# Patient Record
Sex: Female | Born: 1940 | Race: White | Hispanic: No | Marital: Married | Smoking: Former smoker
Health system: Southern US, Community
[De-identification: ages and names within clinical notes are randomized; demographics above are authoritative.]

## PROBLEM LIST (undated history)

## (undated) DIAGNOSIS — E785 Hyperlipidemia, unspecified: Secondary | ICD-10-CM

## (undated) DIAGNOSIS — K8020 Calculus of gallbladder without cholecystitis without obstruction: Secondary | ICD-10-CM

## (undated) DIAGNOSIS — I10 Essential (primary) hypertension: Secondary | ICD-10-CM

## (undated) DIAGNOSIS — F419 Anxiety disorder, unspecified: Secondary | ICD-10-CM

## (undated) DIAGNOSIS — R109 Unspecified abdominal pain: Secondary | ICD-10-CM

## (undated) DIAGNOSIS — M5430 Sciatica, unspecified side: Secondary | ICD-10-CM

## (undated) HISTORY — PX: CHOLECYSTECTOMY: SHX55

## (undated) HISTORY — PX: OTHER SURGICAL HISTORY: SHX169

## (undated) HISTORY — DX: Sciatica, unspecified side: M54.30

## (undated) HISTORY — DX: Calculus of gallbladder without cholecystitis without obstruction: K80.20

## (undated) HISTORY — DX: Anxiety disorder, unspecified: F41.9

## (undated) HISTORY — DX: Essential (primary) hypertension: I10

## (undated) HISTORY — DX: Hyperlipidemia, unspecified: E78.5

## (undated) HISTORY — DX: Unspecified abdominal pain: R10.9

---

## 1987-12-31 DIAGNOSIS — I471 Supraventricular tachycardia, unspecified: Secondary | ICD-10-CM

## 1987-12-31 HISTORY — DX: Supraventricular tachycardia: I47.1

## 1987-12-31 HISTORY — DX: Supraventricular tachycardia, unspecified: I47.10

## 1988-12-30 HISTORY — PX: CHOLECYSTECTOMY: SHX55

## 1996-12-30 DIAGNOSIS — K589 Irritable bowel syndrome without diarrhea: Secondary | ICD-10-CM

## 1996-12-30 HISTORY — DX: Irritable bowel syndrome, unspecified: K58.9

## 1998-12-30 HISTORY — PX: OTHER SURGICAL HISTORY: SHX169

## 2008-08-15 ENCOUNTER — Encounter: Payer: Self-pay | Admitting: Physician Assistant

## 2008-08-15 ENCOUNTER — Encounter: Payer: Self-pay | Admitting: Cardiology

## 2008-08-15 ENCOUNTER — Ambulatory Visit: Payer: Self-pay | Admitting: Cardiology

## 2008-08-16 ENCOUNTER — Encounter: Payer: Self-pay | Admitting: Cardiology

## 2008-09-14 ENCOUNTER — Ambulatory Visit: Payer: Self-pay | Admitting: Cardiology

## 2008-09-15 ENCOUNTER — Encounter: Payer: Self-pay | Admitting: Physician Assistant

## 2008-10-06 ENCOUNTER — Ambulatory Visit: Payer: Self-pay | Admitting: Cardiology

## 2009-08-08 DIAGNOSIS — K449 Diaphragmatic hernia without obstruction or gangrene: Secondary | ICD-10-CM | POA: Insufficient documentation

## 2009-08-08 DIAGNOSIS — D3190 Benign neoplasm of unspecified part of unspecified eye: Secondary | ICD-10-CM | POA: Insufficient documentation

## 2009-08-08 DIAGNOSIS — K219 Gastro-esophageal reflux disease without esophagitis: Secondary | ICD-10-CM | POA: Insufficient documentation

## 2009-08-08 DIAGNOSIS — Z87891 Personal history of nicotine dependence: Secondary | ICD-10-CM | POA: Insufficient documentation

## 2009-08-08 DIAGNOSIS — E785 Hyperlipidemia, unspecified: Secondary | ICD-10-CM | POA: Insufficient documentation

## 2009-08-08 DIAGNOSIS — R079 Chest pain, unspecified: Secondary | ICD-10-CM | POA: Insufficient documentation

## 2009-08-08 DIAGNOSIS — R002 Palpitations: Secondary | ICD-10-CM | POA: Insufficient documentation

## 2009-08-08 DIAGNOSIS — E049 Nontoxic goiter, unspecified: Secondary | ICD-10-CM | POA: Insufficient documentation

## 2009-08-09 ENCOUNTER — Encounter: Payer: Self-pay | Admitting: Cardiology

## 2009-08-09 ENCOUNTER — Ambulatory Visit: Payer: Self-pay | Admitting: Cardiology

## 2009-08-10 ENCOUNTER — Encounter: Payer: Self-pay | Admitting: Cardiology

## 2009-08-24 ENCOUNTER — Encounter (INDEPENDENT_AMBULATORY_CARE_PROVIDER_SITE_OTHER): Payer: Self-pay | Admitting: *Deleted

## 2011-05-14 NOTE — Assessment & Plan Note (Signed)
[facility]                          [facility]ICE NOTE   NAME:Suzanne Davis, Suzanne Davis                         MRN:          [id number]  DATE:10/06/2008                            DOB:          14-Jun-1941    Ms. Knouff is here for the followup post hospitalization of her  evaluation for chest pain and palpitations.  I have reviewed all of her  hospital records.  I have also reviewed the event recorder that she wore  after hospitalization.   The patient was hospitalized on August 15, 2008.  She had some chest  pain and she had heart fluttering.  In the hospital, her workup included  a 2-D echo showing normal LV function and a Cardiolite revealing no scar  and no ischemia.  We arranged for an outpatient event recorder.  This  was done.  The patient responded extensively for a prolonged period of  time with multiple episodes of skipped beats.  Each time she had a  single PVC or single premature atrial contraction.  On a few occasions,  she had 3 or 4 beats of supraventricular tachycardia.  These could  possibly be atrial fib or possibly a form of reentrant supraventricular  tachycardia.  She does not have significant symptoms with these.  She  did not have chest pain.  There was no syncope or presyncope.  She has  continued to be active.  She has not had excess alcohol intake and there  has been no excess caffeine intake.   PAST MEDICAL HISTORY:  Allergies TETRACYCLINE.   MEDICATIONS:  Oxycodone, Xanax, Crestor 5, and aspirin 81 mg.   OTHER MEDICAL PROBLEMS:  See the complete list below.   REVIEW OF SYSTEMS:  She is not having any GI or GU symptoms.  She has no  headaches.  Her review of systems otherwise is negative.   PHYSICAL EXAMINATION:  VITAL SIGNS:  Blood pressure is 129/76 with a  pulse of 74.  GENERAL:  The patient is oriented to person, time, and place.  Affect is  normal.  HEENT:  No xanthelasma.  She has normal extraocular motion.   There are  no carotid bruits.  There is no jugular venous distention.  LUNGS:  Clear.  Respiratory effort is not labored.  CARDIAC:  S1 and S2.  There are no clicks or significant murmurs.  ABDOMEN:  Soft.  She has no significant peripheral edema.   PROBLEMS:  1. Dyslipidemia.  2. Gastroesophageal reflux disease status post esophageal dilatation      in 2005.  3. History of a goiter.  4. History of osteoarthritis.  5. History of benign liver tumor resection.  6. Status post cholecystectomy.  7. Chest pain that was evaluated in the hospital with a normal      Cardiolite.  8. Normal left ventricular function.  9. Palpitations.   The patient gives a history to me today of some supraventricular  tachycardia documented in the past.  She said she had more of this in  the past.  Most recently, she has had palpitations.  As outlined from  the review of the monitor, she has PACs and PVCs and very short bursts  (3-4 beats of supraventricular tachycardia).  I talked with her about  the possibility of a beta blocker or a calcium-channel blocker.  She  prefers not to be treated at this point.  I spoke with her at length  making sure that she knew that she should be reassured and that none of  these rhythms were dangerous for her.  We will not change her meds at  this time.  I will see her back for a cardiology followup in 6 months.     Luis Abed, MD, [facility]  Electronically Signed    JDK/MedQ  DD: 10/06/2008  DT: 10/07/2008  Job #: [id number]

## 2012-02-07 DIAGNOSIS — M549 Dorsalgia, unspecified: Secondary | ICD-10-CM | POA: Diagnosis not present

## 2012-02-07 DIAGNOSIS — L240 Irritant contact dermatitis due to detergents: Secondary | ICD-10-CM | POA: Diagnosis not present

## 2012-02-07 DIAGNOSIS — I10 Essential (primary) hypertension: Secondary | ICD-10-CM | POA: Diagnosis not present

## 2012-02-07 DIAGNOSIS — F411 Generalized anxiety disorder: Secondary | ICD-10-CM | POA: Diagnosis not present

## 2012-03-02 DIAGNOSIS — R21 Rash and other nonspecific skin eruption: Secondary | ICD-10-CM | POA: Diagnosis not present

## 2012-03-02 DIAGNOSIS — Z23 Encounter for immunization: Secondary | ICD-10-CM | POA: Diagnosis not present

## 2012-03-02 DIAGNOSIS — A310 Pulmonary mycobacterial infection: Secondary | ICD-10-CM | POA: Diagnosis not present

## 2012-04-07 DIAGNOSIS — I10 Essential (primary) hypertension: Secondary | ICD-10-CM | POA: Diagnosis not present

## 2012-05-31 DIAGNOSIS — M79609 Pain in unspecified limb: Secondary | ICD-10-CM | POA: Diagnosis not present

## 2012-05-31 DIAGNOSIS — Z79899 Other long term (current) drug therapy: Secondary | ICD-10-CM | POA: Diagnosis not present

## 2012-05-31 DIAGNOSIS — Z86718 Personal history of other venous thrombosis and embolism: Secondary | ICD-10-CM | POA: Diagnosis not present

## 2012-05-31 DIAGNOSIS — Z7982 Long term (current) use of aspirin: Secondary | ICD-10-CM | POA: Diagnosis not present

## 2012-05-31 DIAGNOSIS — M7989 Other specified soft tissue disorders: Secondary | ICD-10-CM | POA: Diagnosis not present

## 2012-06-01 DIAGNOSIS — M7989 Other specified soft tissue disorders: Secondary | ICD-10-CM | POA: Diagnosis not present

## 2012-06-01 DIAGNOSIS — M79609 Pain in unspecified limb: Secondary | ICD-10-CM | POA: Diagnosis not present

## 2012-06-05 DIAGNOSIS — L570 Actinic keratosis: Secondary | ICD-10-CM | POA: Diagnosis not present

## 2012-06-05 DIAGNOSIS — IMO0001 Reserved for inherently not codable concepts without codable children: Secondary | ICD-10-CM | POA: Diagnosis not present

## 2012-06-05 DIAGNOSIS — R5383 Other fatigue: Secondary | ICD-10-CM | POA: Diagnosis not present

## 2012-06-05 DIAGNOSIS — E569 Vitamin deficiency, unspecified: Secondary | ICD-10-CM | POA: Diagnosis not present

## 2012-06-05 DIAGNOSIS — E782 Mixed hyperlipidemia: Secondary | ICD-10-CM | POA: Diagnosis not present

## 2012-06-05 DIAGNOSIS — M549 Dorsalgia, unspecified: Secondary | ICD-10-CM | POA: Diagnosis not present

## 2012-06-11 DIAGNOSIS — D3120 Benign neoplasm of unspecified retina: Secondary | ICD-10-CM | POA: Diagnosis not present

## 2012-06-11 DIAGNOSIS — H04129 Dry eye syndrome of unspecified lacrimal gland: Secondary | ICD-10-CM | POA: Diagnosis not present

## 2012-06-29 DIAGNOSIS — I803 Phlebitis and thrombophlebitis of lower extremities, unspecified: Secondary | ICD-10-CM | POA: Diagnosis not present

## 2012-08-04 DIAGNOSIS — I10 Essential (primary) hypertension: Secondary | ICD-10-CM | POA: Diagnosis not present

## 2012-08-10 DIAGNOSIS — I803 Phlebitis and thrombophlebitis of lower extremities, unspecified: Secondary | ICD-10-CM | POA: Diagnosis not present

## 2012-08-24 DIAGNOSIS — M79609 Pain in unspecified limb: Secondary | ICD-10-CM | POA: Diagnosis not present

## 2012-08-24 DIAGNOSIS — M7989 Other specified soft tissue disorders: Secondary | ICD-10-CM | POA: Diagnosis not present

## 2012-09-02 DIAGNOSIS — L570 Actinic keratosis: Secondary | ICD-10-CM | POA: Diagnosis not present

## 2012-09-15 DIAGNOSIS — N644 Mastodynia: Secondary | ICD-10-CM | POA: Diagnosis not present

## 2012-09-16 DIAGNOSIS — H04129 Dry eye syndrome of unspecified lacrimal gland: Secondary | ICD-10-CM | POA: Diagnosis not present

## 2012-09-16 DIAGNOSIS — D3120 Benign neoplasm of unspecified retina: Secondary | ICD-10-CM | POA: Diagnosis not present

## 2012-10-06 DIAGNOSIS — Z23 Encounter for immunization: Secondary | ICD-10-CM | POA: Diagnosis not present

## 2012-10-30 DIAGNOSIS — M549 Dorsalgia, unspecified: Secondary | ICD-10-CM | POA: Diagnosis not present

## 2012-11-16 DIAGNOSIS — I83893 Varicose veins of bilateral lower extremities with other complications: Secondary | ICD-10-CM | POA: Diagnosis not present

## 2012-12-28 DIAGNOSIS — I10 Essential (primary) hypertension: Secondary | ICD-10-CM | POA: Diagnosis not present

## 2012-12-28 DIAGNOSIS — Z131 Encounter for screening for diabetes mellitus: Secondary | ICD-10-CM | POA: Diagnosis not present

## 2013-02-25 DIAGNOSIS — I10 Essential (primary) hypertension: Secondary | ICD-10-CM | POA: Diagnosis not present

## 2013-03-31 DIAGNOSIS — L259 Unspecified contact dermatitis, unspecified cause: Secondary | ICD-10-CM | POA: Diagnosis not present

## 2013-03-31 DIAGNOSIS — C44519 Basal cell carcinoma of skin of other part of trunk: Secondary | ICD-10-CM | POA: Diagnosis not present

## 2013-03-31 DIAGNOSIS — D485 Neoplasm of uncertain behavior of skin: Secondary | ICD-10-CM | POA: Diagnosis not present

## 2013-03-31 DIAGNOSIS — Z85828 Personal history of other malignant neoplasm of skin: Secondary | ICD-10-CM | POA: Diagnosis not present

## 2013-03-31 DIAGNOSIS — L820 Inflamed seborrheic keratosis: Secondary | ICD-10-CM | POA: Diagnosis not present

## 2013-03-31 DIAGNOSIS — L821 Other seborrheic keratosis: Secondary | ICD-10-CM | POA: Diagnosis not present

## 2013-03-31 DIAGNOSIS — D1801 Hemangioma of skin and subcutaneous tissue: Secondary | ICD-10-CM | POA: Diagnosis not present

## 2013-03-31 DIAGNOSIS — L570 Actinic keratosis: Secondary | ICD-10-CM | POA: Diagnosis not present

## 2013-04-23 DIAGNOSIS — I10 Essential (primary) hypertension: Secondary | ICD-10-CM | POA: Diagnosis not present

## 2013-04-26 DIAGNOSIS — Z23 Encounter for immunization: Secondary | ICD-10-CM | POA: Diagnosis not present

## 2013-04-26 DIAGNOSIS — A318 Other mycobacterial infections: Secondary | ICD-10-CM | POA: Diagnosis not present

## 2013-04-26 DIAGNOSIS — A310 Pulmonary mycobacterial infection: Secondary | ICD-10-CM | POA: Diagnosis not present

## 2013-05-13 DIAGNOSIS — L570 Actinic keratosis: Secondary | ICD-10-CM | POA: Diagnosis not present

## 2013-05-13 DIAGNOSIS — C4491 Basal cell carcinoma of skin, unspecified: Secondary | ICD-10-CM | POA: Diagnosis not present

## 2013-06-25 DIAGNOSIS — K2900 Acute gastritis without bleeding: Secondary | ICD-10-CM | POA: Diagnosis not present

## 2013-07-30 DIAGNOSIS — R109 Unspecified abdominal pain: Secondary | ICD-10-CM | POA: Diagnosis not present

## 2013-07-30 DIAGNOSIS — R1310 Dysphagia, unspecified: Secondary | ICD-10-CM | POA: Diagnosis not present

## 2013-08-16 DIAGNOSIS — K589 Irritable bowel syndrome without diarrhea: Secondary | ICD-10-CM | POA: Diagnosis not present

## 2013-08-16 DIAGNOSIS — Z79899 Other long term (current) drug therapy: Secondary | ICD-10-CM | POA: Diagnosis not present

## 2013-08-16 DIAGNOSIS — M549 Dorsalgia, unspecified: Secondary | ICD-10-CM | POA: Diagnosis not present

## 2013-08-16 DIAGNOSIS — Q391 Atresia of esophagus with tracheo-esophageal fistula: Secondary | ICD-10-CM | POA: Diagnosis not present

## 2013-08-16 DIAGNOSIS — Z8489 Family history of other specified conditions: Secondary | ICD-10-CM | POA: Diagnosis not present

## 2013-08-16 DIAGNOSIS — G8929 Other chronic pain: Secondary | ICD-10-CM | POA: Diagnosis not present

## 2013-08-16 DIAGNOSIS — Z888 Allergy status to other drugs, medicaments and biological substances status: Secondary | ICD-10-CM | POA: Diagnosis not present

## 2013-08-16 DIAGNOSIS — Z91040 Latex allergy status: Secondary | ICD-10-CM | POA: Diagnosis not present

## 2013-08-16 DIAGNOSIS — Z7982 Long term (current) use of aspirin: Secondary | ICD-10-CM | POA: Diagnosis not present

## 2013-08-16 DIAGNOSIS — D126 Benign neoplasm of colon, unspecified: Secondary | ICD-10-CM | POA: Diagnosis not present

## 2013-08-16 DIAGNOSIS — R1310 Dysphagia, unspecified: Secondary | ICD-10-CM | POA: Diagnosis not present

## 2013-08-16 DIAGNOSIS — R109 Unspecified abdominal pain: Secondary | ICD-10-CM | POA: Diagnosis not present

## 2013-08-16 DIAGNOSIS — F411 Generalized anxiety disorder: Secondary | ICD-10-CM | POA: Diagnosis not present

## 2013-08-16 DIAGNOSIS — Z87891 Personal history of nicotine dependence: Secondary | ICD-10-CM | POA: Diagnosis not present

## 2013-08-16 DIAGNOSIS — Z883 Allergy status to other anti-infective agents status: Secondary | ICD-10-CM | POA: Diagnosis not present

## 2013-08-16 DIAGNOSIS — Z823 Family history of stroke: Secondary | ICD-10-CM | POA: Diagnosis not present

## 2013-08-16 DIAGNOSIS — I10 Essential (primary) hypertension: Secondary | ICD-10-CM | POA: Diagnosis not present

## 2013-08-16 DIAGNOSIS — K5730 Diverticulosis of large intestine without perforation or abscess without bleeding: Secondary | ICD-10-CM | POA: Diagnosis not present

## 2013-08-16 DIAGNOSIS — Z91041 Radiographic dye allergy status: Secondary | ICD-10-CM | POA: Diagnosis not present

## 2013-08-16 DIAGNOSIS — K2970 Gastritis, unspecified, without bleeding: Secondary | ICD-10-CM | POA: Diagnosis not present

## 2013-08-17 DIAGNOSIS — Z7982 Long term (current) use of aspirin: Secondary | ICD-10-CM | POA: Diagnosis not present

## 2013-08-17 DIAGNOSIS — R1011 Right upper quadrant pain: Secondary | ICD-10-CM | POA: Diagnosis not present

## 2013-08-17 DIAGNOSIS — R1110 Vomiting, unspecified: Secondary | ICD-10-CM | POA: Diagnosis not present

## 2013-08-17 DIAGNOSIS — R112 Nausea with vomiting, unspecified: Secondary | ICD-10-CM | POA: Diagnosis not present

## 2013-08-17 DIAGNOSIS — R1310 Dysphagia, unspecified: Secondary | ICD-10-CM | POA: Diagnosis not present

## 2013-08-17 DIAGNOSIS — M542 Cervicalgia: Secondary | ICD-10-CM | POA: Diagnosis not present

## 2013-08-17 DIAGNOSIS — Z79899 Other long term (current) drug therapy: Secondary | ICD-10-CM | POA: Diagnosis not present

## 2013-08-24 DIAGNOSIS — K2900 Acute gastritis without bleeding: Secondary | ICD-10-CM | POA: Diagnosis not present

## 2013-08-24 DIAGNOSIS — Z0000 Encounter for general adult medical examination without abnormal findings: Secondary | ICD-10-CM | POA: Diagnosis not present

## 2013-10-21 DIAGNOSIS — E119 Type 2 diabetes mellitus without complications: Secondary | ICD-10-CM | POA: Diagnosis not present

## 2013-10-21 DIAGNOSIS — I10 Essential (primary) hypertension: Secondary | ICD-10-CM | POA: Diagnosis not present

## 2013-10-21 DIAGNOSIS — Z0000 Encounter for general adult medical examination without abnormal findings: Secondary | ICD-10-CM | POA: Diagnosis not present

## 2013-10-27 DIAGNOSIS — J479 Bronchiectasis, uncomplicated: Secondary | ICD-10-CM | POA: Diagnosis not present

## 2013-10-27 DIAGNOSIS — Z7189 Other specified counseling: Secondary | ICD-10-CM | POA: Diagnosis not present

## 2013-10-27 DIAGNOSIS — R918 Other nonspecific abnormal finding of lung field: Secondary | ICD-10-CM | POA: Diagnosis not present

## 2013-10-27 DIAGNOSIS — R002 Palpitations: Secondary | ICD-10-CM | POA: Diagnosis not present

## 2013-10-27 DIAGNOSIS — A310 Pulmonary mycobacterial infection: Secondary | ICD-10-CM | POA: Diagnosis not present

## 2013-10-27 DIAGNOSIS — J984 Other disorders of lung: Secondary | ICD-10-CM | POA: Diagnosis not present

## 2013-10-27 DIAGNOSIS — R490 Dysphonia: Secondary | ICD-10-CM | POA: Diagnosis not present

## 2013-10-27 DIAGNOSIS — R05 Cough: Secondary | ICD-10-CM | POA: Diagnosis not present

## 2013-10-27 DIAGNOSIS — M1990 Unspecified osteoarthritis, unspecified site: Secondary | ICD-10-CM | POA: Diagnosis not present

## 2013-10-29 DIAGNOSIS — J471 Bronchiectasis with (acute) exacerbation: Secondary | ICD-10-CM | POA: Diagnosis not present

## 2013-10-29 DIAGNOSIS — A310 Pulmonary mycobacterial infection: Secondary | ICD-10-CM | POA: Diagnosis not present

## 2013-12-14 DIAGNOSIS — N644 Mastodynia: Secondary | ICD-10-CM | POA: Diagnosis not present

## 2013-12-14 DIAGNOSIS — R928 Other abnormal and inconclusive findings on diagnostic imaging of breast: Secondary | ICD-10-CM | POA: Diagnosis not present

## 2013-12-21 DIAGNOSIS — M549 Dorsalgia, unspecified: Secondary | ICD-10-CM | POA: Diagnosis not present

## 2013-12-21 DIAGNOSIS — I10 Essential (primary) hypertension: Secondary | ICD-10-CM | POA: Diagnosis not present

## 2013-12-29 DIAGNOSIS — M5126 Other intervertebral disc displacement, lumbar region: Secondary | ICD-10-CM | POA: Diagnosis not present

## 2013-12-29 DIAGNOSIS — M47817 Spondylosis without myelopathy or radiculopathy, lumbosacral region: Secondary | ICD-10-CM | POA: Diagnosis not present

## 2013-12-29 DIAGNOSIS — M5137 Other intervertebral disc degeneration, lumbosacral region: Secondary | ICD-10-CM | POA: Diagnosis not present

## 2013-12-29 DIAGNOSIS — M545 Low back pain, unspecified: Secondary | ICD-10-CM | POA: Diagnosis not present

## 2013-12-29 DIAGNOSIS — M48061 Spinal stenosis, lumbar region without neurogenic claudication: Secondary | ICD-10-CM | POA: Diagnosis not present

## 2014-01-17 DIAGNOSIS — M949 Disorder of cartilage, unspecified: Secondary | ICD-10-CM | POA: Diagnosis not present

## 2014-01-17 DIAGNOSIS — M810 Age-related osteoporosis without current pathological fracture: Secondary | ICD-10-CM | POA: Diagnosis not present

## 2014-01-17 DIAGNOSIS — Z780 Asymptomatic menopausal state: Secondary | ICD-10-CM | POA: Diagnosis not present

## 2014-01-17 DIAGNOSIS — M899 Disorder of bone, unspecified: Secondary | ICD-10-CM | POA: Diagnosis not present

## 2014-01-17 DIAGNOSIS — Z7982 Long term (current) use of aspirin: Secondary | ICD-10-CM | POA: Diagnosis not present

## 2014-01-17 DIAGNOSIS — Z79899 Other long term (current) drug therapy: Secondary | ICD-10-CM | POA: Diagnosis not present

## 2014-02-21 DIAGNOSIS — M549 Dorsalgia, unspecified: Secondary | ICD-10-CM | POA: Diagnosis not present

## 2014-02-21 DIAGNOSIS — I10 Essential (primary) hypertension: Secondary | ICD-10-CM | POA: Diagnosis not present

## 2014-03-14 DIAGNOSIS — A310 Pulmonary mycobacterial infection: Secondary | ICD-10-CM | POA: Diagnosis not present

## 2014-03-14 DIAGNOSIS — J479 Bronchiectasis, uncomplicated: Secondary | ICD-10-CM | POA: Diagnosis not present

## 2014-03-24 DIAGNOSIS — M47817 Spondylosis without myelopathy or radiculopathy, lumbosacral region: Secondary | ICD-10-CM | POA: Diagnosis not present

## 2014-04-18 DIAGNOSIS — Z79899 Other long term (current) drug therapy: Secondary | ICD-10-CM | POA: Diagnosis not present

## 2014-04-18 DIAGNOSIS — I10 Essential (primary) hypertension: Secondary | ICD-10-CM | POA: Diagnosis not present

## 2014-04-18 DIAGNOSIS — A310 Pulmonary mycobacterial infection: Secondary | ICD-10-CM | POA: Diagnosis not present

## 2014-04-18 DIAGNOSIS — F4321 Adjustment disorder with depressed mood: Secondary | ICD-10-CM | POA: Diagnosis not present

## 2014-04-18 DIAGNOSIS — A318 Other mycobacterial infections: Secondary | ICD-10-CM | POA: Diagnosis not present

## 2014-04-18 DIAGNOSIS — Z0000 Encounter for general adult medical examination without abnormal findings: Secondary | ICD-10-CM | POA: Diagnosis not present

## 2014-04-18 DIAGNOSIS — Z23 Encounter for immunization: Secondary | ICD-10-CM | POA: Diagnosis not present

## 2014-04-21 DIAGNOSIS — Z131 Encounter for screening for diabetes mellitus: Secondary | ICD-10-CM | POA: Diagnosis not present

## 2014-04-21 DIAGNOSIS — Z0000 Encounter for general adult medical examination without abnormal findings: Secondary | ICD-10-CM | POA: Diagnosis not present

## 2014-04-21 DIAGNOSIS — I10 Essential (primary) hypertension: Secondary | ICD-10-CM | POA: Diagnosis not present

## 2014-04-21 DIAGNOSIS — K219 Gastro-esophageal reflux disease without esophagitis: Secondary | ICD-10-CM | POA: Diagnosis not present

## 2014-06-17 DIAGNOSIS — L2089 Other atopic dermatitis: Secondary | ICD-10-CM | POA: Diagnosis not present

## 2014-06-29 DIAGNOSIS — Z9189 Other specified personal risk factors, not elsewhere classified: Secondary | ICD-10-CM | POA: Diagnosis not present

## 2014-06-29 DIAGNOSIS — Z1231 Encounter for screening mammogram for malignant neoplasm of breast: Secondary | ICD-10-CM | POA: Diagnosis not present

## 2014-06-29 DIAGNOSIS — N952 Postmenopausal atrophic vaginitis: Secondary | ICD-10-CM | POA: Diagnosis not present

## 2014-06-29 DIAGNOSIS — R350 Frequency of micturition: Secondary | ICD-10-CM | POA: Diagnosis not present

## 2014-08-15 DIAGNOSIS — J310 Chronic rhinitis: Secondary | ICD-10-CM | POA: Diagnosis not present

## 2014-08-15 DIAGNOSIS — I10 Essential (primary) hypertension: Secondary | ICD-10-CM | POA: Diagnosis not present

## 2014-10-06 ENCOUNTER — Encounter: Payer: Self-pay | Admitting: Interventional Cardiology

## 2014-10-13 DIAGNOSIS — I10 Essential (primary) hypertension: Secondary | ICD-10-CM | POA: Diagnosis not present

## 2014-10-13 DIAGNOSIS — E119 Type 2 diabetes mellitus without complications: Secondary | ICD-10-CM | POA: Diagnosis not present

## 2014-10-13 DIAGNOSIS — M545 Low back pain: Secondary | ICD-10-CM | POA: Diagnosis not present

## 2014-10-28 ENCOUNTER — Ambulatory Visit: Payer: Self-pay | Admitting: Interventional Cardiology

## 2014-11-16 DIAGNOSIS — H524 Presbyopia: Secondary | ICD-10-CM | POA: Diagnosis not present

## 2014-11-16 DIAGNOSIS — H5203 Hypermetropia, bilateral: Secondary | ICD-10-CM | POA: Diagnosis not present

## 2014-11-16 DIAGNOSIS — H52223 Regular astigmatism, bilateral: Secondary | ICD-10-CM | POA: Diagnosis not present

## 2014-11-16 DIAGNOSIS — H25813 Combined forms of age-related cataract, bilateral: Secondary | ICD-10-CM | POA: Diagnosis not present

## 2014-11-16 DIAGNOSIS — H43811 Vitreous degeneration, right eye: Secondary | ICD-10-CM | POA: Diagnosis not present

## 2014-11-17 DIAGNOSIS — Z23 Encounter for immunization: Secondary | ICD-10-CM | POA: Diagnosis not present

## 2014-11-22 ENCOUNTER — Encounter: Payer: Self-pay | Admitting: Interventional Cardiology

## 2014-11-30 DIAGNOSIS — C44519 Basal cell carcinoma of skin of other part of trunk: Secondary | ICD-10-CM | POA: Diagnosis not present

## 2014-11-30 DIAGNOSIS — L299 Pruritus, unspecified: Secondary | ICD-10-CM | POA: Diagnosis not present

## 2014-11-30 DIAGNOSIS — L570 Actinic keratosis: Secondary | ICD-10-CM | POA: Diagnosis not present

## 2014-11-30 DIAGNOSIS — C4491 Basal cell carcinoma of skin, unspecified: Secondary | ICD-10-CM | POA: Diagnosis not present

## 2014-12-15 DIAGNOSIS — Z0000 Encounter for general adult medical examination without abnormal findings: Secondary | ICD-10-CM | POA: Diagnosis not present

## 2014-12-15 DIAGNOSIS — R1030 Lower abdominal pain, unspecified: Secondary | ICD-10-CM | POA: Diagnosis not present

## 2014-12-15 DIAGNOSIS — Z1389 Encounter for screening for other disorder: Secondary | ICD-10-CM | POA: Diagnosis not present

## 2014-12-15 DIAGNOSIS — E119 Type 2 diabetes mellitus without complications: Secondary | ICD-10-CM | POA: Diagnosis not present

## 2014-12-15 DIAGNOSIS — I10 Essential (primary) hypertension: Secondary | ICD-10-CM | POA: Diagnosis not present

## 2014-12-15 DIAGNOSIS — M545 Low back pain: Secondary | ICD-10-CM | POA: Diagnosis not present

## 2014-12-27 DIAGNOSIS — R1030 Lower abdominal pain, unspecified: Secondary | ICD-10-CM | POA: Diagnosis not present

## 2014-12-27 DIAGNOSIS — Z1231 Encounter for screening mammogram for malignant neoplasm of breast: Secondary | ICD-10-CM | POA: Diagnosis not present

## 2014-12-27 DIAGNOSIS — R102 Pelvic and perineal pain: Secondary | ICD-10-CM | POA: Diagnosis not present

## 2015-02-01 ENCOUNTER — Ambulatory Visit: Payer: Self-pay | Admitting: Interventional Cardiology

## 2015-02-06 DIAGNOSIS — Z8679 Personal history of other diseases of the circulatory system: Secondary | ICD-10-CM | POA: Diagnosis not present

## 2015-02-06 DIAGNOSIS — I491 Atrial premature depolarization: Secondary | ICD-10-CM | POA: Diagnosis not present

## 2015-02-06 DIAGNOSIS — I480 Paroxysmal atrial fibrillation: Secondary | ICD-10-CM | POA: Diagnosis not present

## 2015-02-06 DIAGNOSIS — R002 Palpitations: Secondary | ICD-10-CM | POA: Diagnosis not present

## 2015-02-06 DIAGNOSIS — I471 Supraventricular tachycardia: Secondary | ICD-10-CM | POA: Diagnosis not present

## 2015-02-14 DIAGNOSIS — I10 Essential (primary) hypertension: Secondary | ICD-10-CM | POA: Diagnosis not present

## 2015-02-14 DIAGNOSIS — M542 Cervicalgia: Secondary | ICD-10-CM | POA: Diagnosis not present

## 2015-02-14 DIAGNOSIS — E119 Type 2 diabetes mellitus without complications: Secondary | ICD-10-CM | POA: Diagnosis not present

## 2015-02-23 DIAGNOSIS — R9439 Abnormal result of other cardiovascular function study: Secondary | ICD-10-CM | POA: Diagnosis not present

## 2015-02-23 DIAGNOSIS — R002 Palpitations: Secondary | ICD-10-CM | POA: Diagnosis not present

## 2015-02-23 DIAGNOSIS — Z8679 Personal history of other diseases of the circulatory system: Secondary | ICD-10-CM | POA: Diagnosis not present

## 2015-02-27 DIAGNOSIS — R9439 Abnormal result of other cardiovascular function study: Secondary | ICD-10-CM | POA: Diagnosis not present

## 2015-02-27 DIAGNOSIS — K219 Gastro-esophageal reflux disease without esophagitis: Secondary | ICD-10-CM | POA: Diagnosis not present

## 2015-02-27 DIAGNOSIS — I471 Supraventricular tachycardia: Secondary | ICD-10-CM | POA: Diagnosis not present

## 2015-02-27 DIAGNOSIS — Z79899 Other long term (current) drug therapy: Secondary | ICD-10-CM | POA: Diagnosis not present

## 2015-04-14 DIAGNOSIS — N6459 Other signs and symptoms in breast: Secondary | ICD-10-CM | POA: Diagnosis not present

## 2015-04-14 DIAGNOSIS — I10 Essential (primary) hypertension: Secondary | ICD-10-CM | POA: Diagnosis not present

## 2015-04-14 DIAGNOSIS — E119 Type 2 diabetes mellitus without complications: Secondary | ICD-10-CM | POA: Diagnosis not present

## 2015-04-17 ENCOUNTER — Other Ambulatory Visit: Payer: Self-pay | Admitting: Internal Medicine

## 2015-04-17 DIAGNOSIS — N644 Mastodynia: Secondary | ICD-10-CM

## 2015-04-17 DIAGNOSIS — R922 Inconclusive mammogram: Secondary | ICD-10-CM

## 2015-04-26 DIAGNOSIS — N6459 Other signs and symptoms in breast: Secondary | ICD-10-CM | POA: Diagnosis not present

## 2015-04-26 DIAGNOSIS — R922 Inconclusive mammogram: Secondary | ICD-10-CM | POA: Diagnosis not present

## 2015-05-08 DIAGNOSIS — A310 Pulmonary mycobacterial infection: Secondary | ICD-10-CM | POA: Diagnosis not present

## 2015-05-17 DIAGNOSIS — N644 Mastodynia: Secondary | ICD-10-CM | POA: Diagnosis not present

## 2015-05-17 DIAGNOSIS — N6459 Other signs and symptoms in breast: Secondary | ICD-10-CM | POA: Diagnosis not present

## 2015-06-12 DIAGNOSIS — E119 Type 2 diabetes mellitus without complications: Secondary | ICD-10-CM | POA: Diagnosis not present

## 2015-06-12 DIAGNOSIS — I10 Essential (primary) hypertension: Secondary | ICD-10-CM | POA: Diagnosis not present

## 2015-06-27 ENCOUNTER — Ambulatory Visit
Admission: RE | Admit: 2015-06-27 | Discharge: 2015-06-27 | Disposition: A | Discharge status: Home / Self Care | Payer: Medicare Other | Source: Ambulatory Visit | Attending: Internal Medicine | Admitting: Internal Medicine

## 2015-06-27 DIAGNOSIS — R922 Inconclusive mammogram: Secondary | ICD-10-CM

## 2015-06-27 DIAGNOSIS — N644 Mastodynia: Secondary | ICD-10-CM | POA: Diagnosis not present

## 2015-06-27 MED ORDER — GADOBENATE DIMEGLUMINE 529 MG/ML IV SOLN: 10.0000 mL | Freq: Once | INTRAVENOUS | Status: AC | PRN

## 2015-08-10 DIAGNOSIS — I10 Essential (primary) hypertension: Secondary | ICD-10-CM | POA: Diagnosis not present

## 2015-08-10 DIAGNOSIS — E119 Type 2 diabetes mellitus without complications: Secondary | ICD-10-CM | POA: Diagnosis not present

## 2015-08-16 DIAGNOSIS — I10 Essential (primary) hypertension: Secondary | ICD-10-CM | POA: Diagnosis not present

## 2015-08-16 DIAGNOSIS — E119 Type 2 diabetes mellitus without complications: Secondary | ICD-10-CM | POA: Diagnosis not present

## 2015-10-10 DIAGNOSIS — E119 Type 2 diabetes mellitus without complications: Secondary | ICD-10-CM | POA: Diagnosis not present

## 2015-10-10 DIAGNOSIS — I10 Essential (primary) hypertension: Secondary | ICD-10-CM | POA: Diagnosis not present

## 2015-10-16 DIAGNOSIS — R002 Palpitations: Secondary | ICD-10-CM | POA: Diagnosis not present

## 2015-10-16 DIAGNOSIS — Z8679 Personal history of other diseases of the circulatory system: Secondary | ICD-10-CM | POA: Diagnosis not present

## 2015-10-20 DIAGNOSIS — R002 Palpitations: Secondary | ICD-10-CM | POA: Diagnosis not present

## 2015-10-30 DIAGNOSIS — R002 Palpitations: Secondary | ICD-10-CM | POA: Diagnosis not present

## 2015-12-03 DIAGNOSIS — Z23 Encounter for immunization: Secondary | ICD-10-CM | POA: Diagnosis not present

## 2015-12-11 DIAGNOSIS — E119 Type 2 diabetes mellitus without complications: Secondary | ICD-10-CM | POA: Diagnosis not present

## 2015-12-11 DIAGNOSIS — I10 Essential (primary) hypertension: Secondary | ICD-10-CM | POA: Diagnosis not present

## 2015-12-18 DIAGNOSIS — Z8679 Personal history of other diseases of the circulatory system: Secondary | ICD-10-CM | POA: Diagnosis not present

## 2015-12-18 DIAGNOSIS — E119 Type 2 diabetes mellitus without complications: Secondary | ICD-10-CM | POA: Diagnosis not present

## 2015-12-18 DIAGNOSIS — I10 Essential (primary) hypertension: Secondary | ICD-10-CM | POA: Diagnosis not present

## 2015-12-31 DIAGNOSIS — C439 Malignant melanoma of skin, unspecified: Secondary | ICD-10-CM

## 2015-12-31 HISTORY — DX: Malignant melanoma of skin, unspecified: C43.9

## 2016-02-08 DIAGNOSIS — M545 Low back pain: Secondary | ICD-10-CM | POA: Diagnosis not present

## 2016-02-08 DIAGNOSIS — I10 Essential (primary) hypertension: Secondary | ICD-10-CM | POA: Diagnosis not present

## 2016-02-08 DIAGNOSIS — E119 Type 2 diabetes mellitus without complications: Secondary | ICD-10-CM | POA: Diagnosis not present

## 2016-02-08 DIAGNOSIS — Z79899 Other long term (current) drug therapy: Secondary | ICD-10-CM | POA: Diagnosis not present

## 2016-02-08 DIAGNOSIS — Z0000 Encounter for general adult medical examination without abnormal findings: Secondary | ICD-10-CM | POA: Diagnosis not present

## 2016-02-08 DIAGNOSIS — Z1389 Encounter for screening for other disorder: Secondary | ICD-10-CM | POA: Diagnosis not present

## 2016-04-08 DIAGNOSIS — M545 Low back pain: Secondary | ICD-10-CM | POA: Diagnosis not present

## 2016-04-08 DIAGNOSIS — E119 Type 2 diabetes mellitus without complications: Secondary | ICD-10-CM | POA: Diagnosis not present

## 2016-04-08 DIAGNOSIS — I10 Essential (primary) hypertension: Secondary | ICD-10-CM | POA: Diagnosis not present

## 2016-04-16 DIAGNOSIS — M79641 Pain in right hand: Secondary | ICD-10-CM | POA: Diagnosis not present

## 2016-04-16 DIAGNOSIS — M5030 Other cervical disc degeneration, unspecified cervical region: Secondary | ICD-10-CM | POA: Diagnosis not present

## 2016-04-16 DIAGNOSIS — M5136 Other intervertebral disc degeneration, lumbar region: Secondary | ICD-10-CM | POA: Diagnosis not present

## 2016-04-16 DIAGNOSIS — M79642 Pain in left hand: Secondary | ICD-10-CM | POA: Diagnosis not present

## 2016-04-29 DIAGNOSIS — M79642 Pain in left hand: Secondary | ICD-10-CM | POA: Diagnosis not present

## 2016-04-29 DIAGNOSIS — M79641 Pain in right hand: Secondary | ICD-10-CM | POA: Diagnosis not present

## 2016-04-30 ENCOUNTER — Other Ambulatory Visit: Payer: Self-pay

## 2016-04-30 DIAGNOSIS — Z1231 Encounter for screening mammogram for malignant neoplasm of breast: Secondary | ICD-10-CM

## 2016-05-13 DIAGNOSIS — L551 Sunburn of second degree: Secondary | ICD-10-CM | POA: Diagnosis not present

## 2016-05-15 ENCOUNTER — Ambulatory Visit
Admission: RE | Admit: 2016-05-15 | Discharge: 2016-05-15 | Disposition: A | Discharge status: Home / Self Care | Payer: Medicare Other | Source: Ambulatory Visit

## 2016-05-15 DIAGNOSIS — Z1231 Encounter for screening mammogram for malignant neoplasm of breast: Secondary | ICD-10-CM | POA: Diagnosis not present

## 2016-06-06 DIAGNOSIS — E119 Type 2 diabetes mellitus without complications: Secondary | ICD-10-CM | POA: Diagnosis not present

## 2016-06-06 DIAGNOSIS — I10 Essential (primary) hypertension: Secondary | ICD-10-CM | POA: Diagnosis not present

## 2016-06-06 DIAGNOSIS — J4541 Moderate persistent asthma with (acute) exacerbation: Secondary | ICD-10-CM | POA: Diagnosis not present

## 2016-06-06 DIAGNOSIS — M545 Low back pain: Secondary | ICD-10-CM | POA: Diagnosis not present

## 2016-06-11 DIAGNOSIS — L237 Allergic contact dermatitis due to plants, except food: Secondary | ICD-10-CM | POA: Diagnosis not present

## 2016-06-13 DIAGNOSIS — I10 Essential (primary) hypertension: Secondary | ICD-10-CM | POA: Diagnosis not present

## 2016-06-13 DIAGNOSIS — E119 Type 2 diabetes mellitus without complications: Secondary | ICD-10-CM | POA: Diagnosis not present

## 2016-06-13 DIAGNOSIS — M545 Low back pain: Secondary | ICD-10-CM | POA: Diagnosis not present

## 2016-06-18 DIAGNOSIS — H524 Presbyopia: Secondary | ICD-10-CM | POA: Diagnosis not present

## 2016-06-18 DIAGNOSIS — H43811 Vitreous degeneration, right eye: Secondary | ICD-10-CM | POA: Diagnosis not present

## 2016-06-18 DIAGNOSIS — H5203 Hypermetropia, bilateral: Secondary | ICD-10-CM | POA: Diagnosis not present

## 2016-06-18 DIAGNOSIS — H52223 Regular astigmatism, bilateral: Secondary | ICD-10-CM | POA: Diagnosis not present

## 2016-06-18 DIAGNOSIS — H25813 Combined forms of age-related cataract, bilateral: Secondary | ICD-10-CM | POA: Diagnosis not present

## 2016-06-19 DIAGNOSIS — E119 Type 2 diabetes mellitus without complications: Secondary | ICD-10-CM | POA: Diagnosis not present

## 2016-06-19 DIAGNOSIS — J4541 Moderate persistent asthma with (acute) exacerbation: Secondary | ICD-10-CM | POA: Diagnosis not present

## 2016-06-19 DIAGNOSIS — I10 Essential (primary) hypertension: Secondary | ICD-10-CM | POA: Diagnosis not present

## 2016-08-06 DIAGNOSIS — M545 Low back pain: Secondary | ICD-10-CM | POA: Diagnosis not present

## 2016-08-06 DIAGNOSIS — I10 Essential (primary) hypertension: Secondary | ICD-10-CM | POA: Diagnosis not present

## 2016-08-06 DIAGNOSIS — J4541 Moderate persistent asthma with (acute) exacerbation: Secondary | ICD-10-CM | POA: Diagnosis not present

## 2016-08-06 DIAGNOSIS — E119 Type 2 diabetes mellitus without complications: Secondary | ICD-10-CM | POA: Diagnosis not present

## 2016-10-01 DIAGNOSIS — Z23 Encounter for immunization: Secondary | ICD-10-CM | POA: Diagnosis not present

## 2016-10-03 DIAGNOSIS — E119 Type 2 diabetes mellitus without complications: Secondary | ICD-10-CM | POA: Diagnosis not present

## 2016-10-03 DIAGNOSIS — I10 Essential (primary) hypertension: Secondary | ICD-10-CM | POA: Diagnosis not present

## 2016-10-03 DIAGNOSIS — J4541 Moderate persistent asthma with (acute) exacerbation: Secondary | ICD-10-CM | POA: Diagnosis not present

## 2016-10-03 DIAGNOSIS — M545 Low back pain: Secondary | ICD-10-CM | POA: Diagnosis not present

## 2016-10-17 DIAGNOSIS — R112 Nausea with vomiting, unspecified: Secondary | ICD-10-CM | POA: Diagnosis not present

## 2016-10-17 DIAGNOSIS — R42 Dizziness and giddiness: Secondary | ICD-10-CM | POA: Diagnosis not present

## 2016-10-17 DIAGNOSIS — R51 Headache: Secondary | ICD-10-CM | POA: Diagnosis not present

## 2016-10-17 DIAGNOSIS — I10 Essential (primary) hypertension: Secondary | ICD-10-CM | POA: Diagnosis not present

## 2016-10-21 DIAGNOSIS — E119 Type 2 diabetes mellitus without complications: Secondary | ICD-10-CM | POA: Diagnosis not present

## 2016-10-21 DIAGNOSIS — I10 Essential (primary) hypertension: Secondary | ICD-10-CM | POA: Diagnosis not present

## 2016-10-21 DIAGNOSIS — M545 Low back pain: Secondary | ICD-10-CM | POA: Diagnosis not present

## 2016-10-28 DIAGNOSIS — Z85828 Personal history of other malignant neoplasm of skin: Secondary | ICD-10-CM | POA: Diagnosis not present

## 2016-10-28 DIAGNOSIS — H671 Otitis media in diseases classified elsewhere, right ear: Secondary | ICD-10-CM | POA: Diagnosis not present

## 2016-10-28 DIAGNOSIS — C44622 Squamous cell carcinoma of skin of right upper limb, including shoulder: Secondary | ICD-10-CM | POA: Diagnosis not present

## 2016-10-28 DIAGNOSIS — D485 Neoplasm of uncertain behavior of skin: Secondary | ICD-10-CM | POA: Diagnosis not present

## 2016-10-28 DIAGNOSIS — L570 Actinic keratosis: Secondary | ICD-10-CM | POA: Diagnosis not present

## 2016-11-07 DIAGNOSIS — D0361 Melanoma in situ of right upper limb, including shoulder: Secondary | ICD-10-CM | POA: Diagnosis not present

## 2016-11-07 DIAGNOSIS — C4371 Malignant melanoma of right lower limb, including hip: Secondary | ICD-10-CM | POA: Diagnosis not present

## 2016-11-19 DIAGNOSIS — L988 Other specified disorders of the skin and subcutaneous tissue: Secondary | ICD-10-CM | POA: Diagnosis not present

## 2016-11-19 DIAGNOSIS — C4361 Malignant melanoma of right upper limb, including shoulder: Secondary | ICD-10-CM | POA: Diagnosis not present

## 2016-11-20 DIAGNOSIS — E119 Type 2 diabetes mellitus without complications: Secondary | ICD-10-CM | POA: Diagnosis not present

## 2016-11-20 DIAGNOSIS — M545 Low back pain: Secondary | ICD-10-CM | POA: Diagnosis not present

## 2016-11-20 DIAGNOSIS — I10 Essential (primary) hypertension: Secondary | ICD-10-CM | POA: Diagnosis not present

## 2016-12-03 DIAGNOSIS — M545 Low back pain: Secondary | ICD-10-CM | POA: Diagnosis not present

## 2016-12-03 DIAGNOSIS — I10 Essential (primary) hypertension: Secondary | ICD-10-CM | POA: Diagnosis not present

## 2016-12-03 DIAGNOSIS — Z4802 Encounter for removal of sutures: Secondary | ICD-10-CM | POA: Diagnosis not present

## 2016-12-09 DIAGNOSIS — J4541 Moderate persistent asthma with (acute) exacerbation: Secondary | ICD-10-CM | POA: Diagnosis not present

## 2016-12-13 ENCOUNTER — Other Ambulatory Visit: Payer: Self-pay | Admitting: Internal Medicine

## 2016-12-13 DIAGNOSIS — N6459 Other signs and symptoms in breast: Secondary | ICD-10-CM

## 2016-12-13 DIAGNOSIS — M810 Age-related osteoporosis without current pathological fracture: Secondary | ICD-10-CM

## 2016-12-18 DIAGNOSIS — Z131 Encounter for screening for diabetes mellitus: Secondary | ICD-10-CM | POA: Diagnosis not present

## 2016-12-18 DIAGNOSIS — I10 Essential (primary) hypertension: Secondary | ICD-10-CM | POA: Diagnosis not present

## 2016-12-27 ENCOUNTER — Other Ambulatory Visit: Payer: Medicare Other

## 2016-12-27 ENCOUNTER — Other Ambulatory Visit: Payer: Self-pay | Admitting: Internal Medicine

## 2016-12-27 ENCOUNTER — Ambulatory Visit
Admission: RE | Admit: 2016-12-27 | Discharge: 2016-12-27 | Disposition: A | Discharge status: Home / Self Care | Payer: Medicare Other | Source: Ambulatory Visit | Attending: Internal Medicine | Admitting: Internal Medicine

## 2016-12-27 DIAGNOSIS — N6459 Other signs and symptoms in breast: Secondary | ICD-10-CM

## 2016-12-27 DIAGNOSIS — M810 Age-related osteoporosis without current pathological fracture: Secondary | ICD-10-CM

## 2016-12-27 DIAGNOSIS — N644 Mastodynia: Secondary | ICD-10-CM | POA: Diagnosis not present

## 2016-12-27 DIAGNOSIS — M85852 Other specified disorders of bone density and structure, left thigh: Secondary | ICD-10-CM | POA: Diagnosis not present

## 2016-12-27 DIAGNOSIS — R922 Inconclusive mammogram: Secondary | ICD-10-CM | POA: Diagnosis not present

## 2016-12-27 DIAGNOSIS — Z780 Asymptomatic menopausal state: Secondary | ICD-10-CM | POA: Diagnosis not present

## 2016-12-27 MED ORDER — GADOBENATE DIMEGLUMINE 529 MG/ML IV SOLN
12.0000 mL | Freq: Once | INTRAVENOUS | Status: AC | PRN
  Administered 2016-12-27: 12 mL via INTRAVENOUS

## 2017-01-14 DIAGNOSIS — M545 Low back pain: Secondary | ICD-10-CM | POA: Diagnosis not present

## 2017-01-14 DIAGNOSIS — I10 Essential (primary) hypertension: Secondary | ICD-10-CM | POA: Diagnosis not present

## 2017-02-04 DIAGNOSIS — M545 Low back pain: Secondary | ICD-10-CM | POA: Diagnosis not present

## 2017-02-04 DIAGNOSIS — I10 Essential (primary) hypertension: Secondary | ICD-10-CM | POA: Diagnosis not present

## 2017-03-25 DIAGNOSIS — L570 Actinic keratosis: Secondary | ICD-10-CM | POA: Diagnosis not present

## 2017-04-04 DIAGNOSIS — Z0000 Encounter for general adult medical examination without abnormal findings: Secondary | ICD-10-CM | POA: Diagnosis not present

## 2017-04-04 DIAGNOSIS — M545 Low back pain: Secondary | ICD-10-CM | POA: Diagnosis not present

## 2017-04-04 DIAGNOSIS — R5383 Other fatigue: Secondary | ICD-10-CM | POA: Diagnosis not present

## 2017-04-04 DIAGNOSIS — I10 Essential (primary) hypertension: Secondary | ICD-10-CM | POA: Diagnosis not present

## 2017-04-04 DIAGNOSIS — R7303 Prediabetes: Secondary | ICD-10-CM | POA: Diagnosis not present

## 2017-04-04 DIAGNOSIS — R002 Palpitations: Secondary | ICD-10-CM | POA: Diagnosis not present

## 2017-04-04 DIAGNOSIS — Z1389 Encounter for screening for other disorder: Secondary | ICD-10-CM | POA: Diagnosis not present

## 2017-04-04 DIAGNOSIS — Z131 Encounter for screening for diabetes mellitus: Secondary | ICD-10-CM | POA: Diagnosis not present

## 2017-05-20 DIAGNOSIS — H903 Sensorineural hearing loss, bilateral: Secondary | ICD-10-CM | POA: Diagnosis not present

## 2017-06-03 DIAGNOSIS — M545 Low back pain: Secondary | ICD-10-CM | POA: Diagnosis not present

## 2017-06-03 DIAGNOSIS — R002 Palpitations: Secondary | ICD-10-CM | POA: Diagnosis not present

## 2017-06-03 DIAGNOSIS — I10 Essential (primary) hypertension: Secondary | ICD-10-CM | POA: Diagnosis not present

## 2017-07-30 DIAGNOSIS — M818 Other osteoporosis without current pathological fracture: Secondary | ICD-10-CM | POA: Diagnosis not present

## 2017-07-30 DIAGNOSIS — I10 Essential (primary) hypertension: Secondary | ICD-10-CM | POA: Diagnosis not present

## 2017-07-30 DIAGNOSIS — M545 Low back pain: Secondary | ICD-10-CM | POA: Diagnosis not present

## 2017-08-24 DIAGNOSIS — R1011 Right upper quadrant pain: Secondary | ICD-10-CM | POA: Diagnosis not present

## 2017-08-24 DIAGNOSIS — M542 Cervicalgia: Secondary | ICD-10-CM | POA: Diagnosis not present

## 2017-08-24 DIAGNOSIS — M25511 Pain in right shoulder: Secondary | ICD-10-CM | POA: Diagnosis not present

## 2017-08-24 DIAGNOSIS — X58XXXA Exposure to other specified factors, initial encounter: Secondary | ICD-10-CM | POA: Diagnosis not present

## 2017-08-24 DIAGNOSIS — F419 Anxiety disorder, unspecified: Secondary | ICD-10-CM | POA: Diagnosis not present

## 2017-08-24 DIAGNOSIS — Z79899 Other long term (current) drug therapy: Secondary | ICD-10-CM | POA: Diagnosis not present

## 2017-08-24 DIAGNOSIS — Z7982 Long term (current) use of aspirin: Secondary | ICD-10-CM | POA: Diagnosis not present

## 2017-08-24 DIAGNOSIS — S161XXA Strain of muscle, fascia and tendon at neck level, initial encounter: Secondary | ICD-10-CM | POA: Diagnosis not present

## 2017-08-24 DIAGNOSIS — E7800 Pure hypercholesterolemia, unspecified: Secondary | ICD-10-CM | POA: Diagnosis not present

## 2017-09-15 DIAGNOSIS — R002 Palpitations: Secondary | ICD-10-CM | POA: Diagnosis not present

## 2017-09-15 DIAGNOSIS — E782 Mixed hyperlipidemia: Secondary | ICD-10-CM | POA: Diagnosis not present

## 2017-09-15 DIAGNOSIS — R0600 Dyspnea, unspecified: Secondary | ICD-10-CM | POA: Diagnosis not present

## 2017-09-15 DIAGNOSIS — R072 Precordial pain: Secondary | ICD-10-CM | POA: Diagnosis not present

## 2017-09-16 DIAGNOSIS — H04123 Dry eye syndrome of bilateral lacrimal glands: Secondary | ICD-10-CM | POA: Diagnosis not present

## 2017-09-16 DIAGNOSIS — I351 Nonrheumatic aortic (valve) insufficiency: Secondary | ICD-10-CM | POA: Diagnosis not present

## 2017-09-16 DIAGNOSIS — R0602 Shortness of breath: Secondary | ICD-10-CM | POA: Diagnosis not present

## 2017-09-16 DIAGNOSIS — H524 Presbyopia: Secondary | ICD-10-CM | POA: Diagnosis not present

## 2017-09-16 DIAGNOSIS — H11153 Pinguecula, bilateral: Secondary | ICD-10-CM | POA: Diagnosis not present

## 2017-09-23 DIAGNOSIS — R197 Diarrhea, unspecified: Secondary | ICD-10-CM | POA: Diagnosis not present

## 2017-09-23 DIAGNOSIS — L821 Other seborrheic keratosis: Secondary | ICD-10-CM | POA: Diagnosis not present

## 2017-09-23 DIAGNOSIS — D485 Neoplasm of uncertain behavior of skin: Secondary | ICD-10-CM | POA: Diagnosis not present

## 2017-09-23 DIAGNOSIS — Z85820 Personal history of malignant melanoma of skin: Secondary | ICD-10-CM | POA: Diagnosis not present

## 2017-09-23 DIAGNOSIS — L814 Other melanin hyperpigmentation: Secondary | ICD-10-CM | POA: Diagnosis not present

## 2017-09-23 DIAGNOSIS — L570 Actinic keratosis: Secondary | ICD-10-CM | POA: Diagnosis not present

## 2017-09-29 DIAGNOSIS — R072 Precordial pain: Secondary | ICD-10-CM | POA: Diagnosis not present

## 2017-09-30 DIAGNOSIS — E785 Hyperlipidemia, unspecified: Secondary | ICD-10-CM | POA: Diagnosis not present

## 2017-09-30 DIAGNOSIS — M818 Other osteoporosis without current pathological fracture: Secondary | ICD-10-CM | POA: Diagnosis not present

## 2017-09-30 DIAGNOSIS — I10 Essential (primary) hypertension: Secondary | ICD-10-CM | POA: Diagnosis not present

## 2017-09-30 DIAGNOSIS — M545 Low back pain: Secondary | ICD-10-CM | POA: Diagnosis not present

## 2017-10-06 DIAGNOSIS — Z23 Encounter for immunization: Secondary | ICD-10-CM | POA: Diagnosis not present

## 2017-11-25 DIAGNOSIS — M545 Low back pain: Secondary | ICD-10-CM | POA: Diagnosis not present

## 2017-11-28 DIAGNOSIS — M5136 Other intervertebral disc degeneration, lumbar region: Secondary | ICD-10-CM | POA: Diagnosis not present

## 2017-11-28 DIAGNOSIS — M545 Low back pain: Secondary | ICD-10-CM | POA: Diagnosis not present

## 2017-11-28 DIAGNOSIS — M4807 Spinal stenosis, lumbosacral region: Secondary | ICD-10-CM | POA: Diagnosis not present

## 2017-11-28 DIAGNOSIS — M47817 Spondylosis without myelopathy or radiculopathy, lumbosacral region: Secondary | ICD-10-CM | POA: Diagnosis not present

## 2017-11-28 DIAGNOSIS — M5127 Other intervertebral disc displacement, lumbosacral region: Secondary | ICD-10-CM | POA: Diagnosis not present

## 2017-11-28 DIAGNOSIS — M48061 Spinal stenosis, lumbar region without neurogenic claudication: Secondary | ICD-10-CM | POA: Diagnosis not present

## 2017-12-11 ENCOUNTER — Other Ambulatory Visit: Payer: Self-pay | Admitting: Internal Medicine

## 2017-12-11 DIAGNOSIS — Z1231 Encounter for screening mammogram for malignant neoplasm of breast: Secondary | ICD-10-CM

## 2017-12-16 DIAGNOSIS — R030 Elevated blood-pressure reading, without diagnosis of hypertension: Secondary | ICD-10-CM | POA: Diagnosis not present

## 2017-12-16 DIAGNOSIS — M545 Low back pain: Secondary | ICD-10-CM | POA: Diagnosis not present

## 2017-12-17 DIAGNOSIS — E119 Type 2 diabetes mellitus without complications: Secondary | ICD-10-CM | POA: Diagnosis not present

## 2017-12-17 DIAGNOSIS — E7849 Other hyperlipidemia: Secondary | ICD-10-CM | POA: Diagnosis not present

## 2017-12-17 DIAGNOSIS — Z79899 Other long term (current) drug therapy: Secondary | ICD-10-CM | POA: Diagnosis not present

## 2017-12-17 DIAGNOSIS — M545 Low back pain: Secondary | ICD-10-CM | POA: Diagnosis not present

## 2017-12-17 DIAGNOSIS — Z1231 Encounter for screening mammogram for malignant neoplasm of breast: Secondary | ICD-10-CM | POA: Diagnosis not present

## 2018-01-09 ENCOUNTER — Ambulatory Visit: Payer: Medicare Other

## 2018-01-27 DIAGNOSIS — J4541 Moderate persistent asthma with (acute) exacerbation: Secondary | ICD-10-CM | POA: Diagnosis not present

## 2018-01-27 DIAGNOSIS — E119 Type 2 diabetes mellitus without complications: Secondary | ICD-10-CM | POA: Diagnosis not present

## 2018-01-27 DIAGNOSIS — M545 Low back pain: Secondary | ICD-10-CM | POA: Diagnosis not present

## 2018-02-05 ENCOUNTER — Ambulatory Visit (HOSPITAL_COMMUNITY)
Admission: RE | Admit: 2018-02-05 | Discharge: 2018-02-05 | Disposition: A | Discharge status: Home / Self Care | Payer: Medicare Other | Source: Ambulatory Visit | Attending: Internal Medicine | Admitting: Internal Medicine

## 2018-02-05 ENCOUNTER — Encounter (HOSPITAL_COMMUNITY): Payer: Self-pay | Admitting: Radiology

## 2018-02-05 DIAGNOSIS — Z1231 Encounter for screening mammogram for malignant neoplasm of breast: Secondary | ICD-10-CM | POA: Diagnosis not present

## 2018-03-23 DIAGNOSIS — Z85828 Personal history of other malignant neoplasm of skin: Secondary | ICD-10-CM | POA: Diagnosis not present

## 2018-03-23 DIAGNOSIS — Z85820 Personal history of malignant melanoma of skin: Secondary | ICD-10-CM | POA: Diagnosis not present

## 2018-03-23 DIAGNOSIS — L570 Actinic keratosis: Secondary | ICD-10-CM | POA: Diagnosis not present

## 2018-03-25 DIAGNOSIS — J4541 Moderate persistent asthma with (acute) exacerbation: Secondary | ICD-10-CM | POA: Diagnosis not present

## 2018-03-25 DIAGNOSIS — I10 Essential (primary) hypertension: Secondary | ICD-10-CM | POA: Diagnosis not present

## 2018-03-25 DIAGNOSIS — M545 Low back pain: Secondary | ICD-10-CM | POA: Diagnosis not present

## 2018-03-25 DIAGNOSIS — E119 Type 2 diabetes mellitus without complications: Secondary | ICD-10-CM | POA: Diagnosis not present

## 2018-04-02 DIAGNOSIS — R002 Palpitations: Secondary | ICD-10-CM | POA: Diagnosis not present

## 2018-04-02 DIAGNOSIS — Z87898 Personal history of other specified conditions: Secondary | ICD-10-CM | POA: Diagnosis not present

## 2018-04-10 ENCOUNTER — Encounter (HOSPITAL_COMMUNITY): Payer: Self-pay

## 2018-04-10 ENCOUNTER — Emergency Department (HOSPITAL_COMMUNITY)
Admission: EM | Admit: 2018-04-10 | Discharge: 2018-04-11 | Disposition: A | Discharge status: Home / Self Care | Payer: Medicare Other | Attending: Emergency Medicine | Admitting: Emergency Medicine

## 2018-04-10 ENCOUNTER — Emergency Department (HOSPITAL_COMMUNITY): Payer: Medicare Other

## 2018-04-10 DIAGNOSIS — R002 Palpitations: Secondary | ICD-10-CM | POA: Insufficient documentation

## 2018-04-10 DIAGNOSIS — R42 Dizziness and giddiness: Secondary | ICD-10-CM | POA: Diagnosis not present

## 2018-04-10 DIAGNOSIS — R079 Chest pain, unspecified: Secondary | ICD-10-CM | POA: Diagnosis not present

## 2018-04-10 LAB — CBC
HCT: 37.9 % (ref 36.0–46.0)
Hemoglobin: 12.4 g/dL (ref 12.0–15.0)
MCH: 30 pg (ref 26.0–34.0)
MCHC: 32.7 g/dL (ref 30.0–36.0)
MCV: 91.8 fL (ref 78.0–100.0)
Platelets: 275 10*3/uL (ref 150–400)
RBC: 4.13 MIL/uL (ref 3.87–5.11)
RDW: 13.5 % (ref 11.5–15.5)
WBC: 7.5 10*3/uL (ref 4.0–10.5)

## 2018-04-10 LAB — BASIC METABOLIC PANEL WITH GFR
Anion gap: 10 (ref 5–15)
BUN: 14 mg/dL (ref 6–20)
CO2: 27 mmol/L (ref 22–32)
Calcium: 9.6 mg/dL (ref 8.9–10.3)
Chloride: 102 mmol/L (ref 101–111)
Creatinine, Ser: 0.65 mg/dL (ref 0.44–1.00)
GFR calc Af Amer: >60 mL/min
GFR calc non Af Amer: >60 mL/min
Glucose, Bld: 128 mg/dL — ABNORMAL HIGH (ref 65–99)
Potassium: 4.2 mmol/L (ref 3.5–5.1)
Sodium: 139 mmol/L (ref 135–145)

## 2018-04-10 LAB — I-STAT TROPONIN, ED: TROPONIN I, POC: 0 ng/mL (ref 0.00–0.08)

## 2018-04-10 NOTE — ED Triage Notes (Signed)
Pt states that around 830 she began to feel like her heart was beating fast, and irregular, c/o of dizziness and CP.

## 2018-04-11 DIAGNOSIS — R002 Palpitations: Secondary | ICD-10-CM | POA: Diagnosis not present

## 2018-04-11 LAB — I-STAT TROPONIN, ED: TROPONIN I, POC: 0.01 ng/mL (ref 0.00–0.08)

## 2018-04-11 NOTE — ED Notes (Signed)
Labs reviewed.

## 2018-04-11 NOTE — ED Provider Notes (Signed)
[facility] EMERGENCY DEPARTMENT Provider Note   CSN: [id number] Arrival date & time: 04/10/18  2211     History   Chief Complaint Chief Complaint  Patient presents with  . Palpations  . Dizziness    HPI Suzanne Davis is a 77 y.o. female.  77 yo F with a chief complaint of palpitations and lightheadedness.  This is been an ongoing issue for her.  Going on for many years.  She is beginning to get frustrated with it and it has started to affect her life more.  She saw a physician at [facility], but they were unable to get a Holter monitor that did not require cell phone service.  She states she gets a fast palpitations that resolved with a vagal maneuver.  Seem to be regular.  She denies caffeine use lower extremity edema fevers or chills.  Denies coughing congestion.  Denies chest pain or shortness of breath.  The history is provided by the patient.  Dizziness  Quality:  Lightheadedness Associated symptoms: palpitations   Associated symptoms: no chest pain, no headaches, no nausea, no shortness of breath and no vomiting   Illness  This is a new problem. The current episode started more than 1 week ago. The problem occurs constantly. The problem has not changed since onset.Pertinent negatives include no chest pain, no headaches and no shortness of breath. Nothing aggravates the symptoms. Nothing relieves the symptoms. She has tried nothing for the symptoms. The treatment provided no relief.    History reviewed. No pertinent past medical history.  Patient Active Problem List   Diagnosis Date Noted  . BENIGN NEOPLASM OF EYE PART UNSPECIFIED 08/08/2009  . GOITER, UNSPECIFIED 08/08/2009  . DYSLIPIDEMIA 08/08/2009  . ESOPHAGEAL REFLUX 08/08/2009  . 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 PALPITATIONS 08/08/2009  . CHEST PAIN UNSPECIFIED 08/08/2009  . PERS HX TOBACCO USE PRESENTING HAZARDS HEALTH 08/08/2009    History reviewed. No pertinent  surgical history.   OB History   None      Home Medications    Prior to Admission medications   Not on File    Family History No family history on file.  Social History Social History   Tobacco Use  . Smoking status: Not on file  Substance Use Topics  . Alcohol use: Not on file  . Drug use: Not on file     Allergies   Ethambutol and Tetracycline   Review of Systems Review of Systems  Constitutional: Negative for chills and fever.  HENT: Negative for congestion and rhinorrhea.   Eyes: Negative for redness and visual disturbance.  Respiratory: Negative for shortness of breath and wheezing.   Cardiovascular: Positive for palpitations. Negative for chest pain.  Gastrointestinal: Negative for nausea and vomiting.  Genitourinary: Negative for dysuria and urgency.  Musculoskeletal: Negative for arthralgias and myalgias.  Skin: Negative for pallor and wound.  Neurological: Positive for light-headedness. Negative for dizziness and headaches.     Physical Exam Updated Vital Signs BP (!) 154/53 (BP Location: Left Arm)   Pulse 68   Temp 98.2 F (36.8 C) (Oral)   Resp 18   SpO2 100%   Physical Exam  Constitutional: She is oriented to person, place, and time. She appears well-developed and well-nourished. No distress.  HENT:  Head: Normocephalic and atraumatic.  Eyes: Pupils are equal, round, and reactive to light. EOM are normal.  Neck: Normal range of motion. Neck supple.  Cardiovascular: Normal rate and regular rhythm. Exam reveals  no gallop and no friction rub.  No murmur heard. Pulmonary/Chest: Effort normal. She has no wheezes. She has no rales.  Abdominal: Soft. She exhibits no distension. There is no tenderness.  Musculoskeletal: She exhibits no edema or tenderness.  Neurological: She is alert and oriented to person, place, and time.  Skin: Skin is warm and dry. She is not diaphoretic.  Psychiatric: She has a normal mood and affect. Her behavior is  normal.  Nursing note and vitals reviewed.    ED Treatments / Results  Labs (all labs ordered are listed, but only abnormal results are displayed) Labs Reviewed  BASIC METABOLIC PANEL - Abnormal; Notable for the following components:      Result Value   Glucose, Bld 128 (*)    All other components within normal limits  CBC  I-STAT TROPONIN, ED  I-STAT TROPONIN, ED    EKG EKG Interpretation  Date/Time:  Friday April 10 2018 22:23:50 EDT Ventricular Rate:  71 PR Interval:  152 QRS Duration: 82 QT Interval:  370 QTC Calculation: 402 R Axis:   -57 Text Interpretation:  Normal sinus rhythm Left axis deviation Abnormal QRS-T angle, consider primary T wave abnormality Abnormal ECG No old tracing to compare Confirmed by Deno Etienne [phone]) on 04/11/2018 5:27:37 AM   Radiology Dg Chest 2 View  Result Date: 04/10/2018 CLINICAL DATA:  Chest pain. EXAM: CHEST - 2 VIEW COMPARISON:  Chest CT 10/26/2010 performed at [facility]. FINDINGS: Normal heart size and mediastinal contours. Stable scarring at the right lung base with elevated right hemidiaphragm. No pulmonary edema. No focal airspace disease, pleural effusion or pneumothorax. Right apical pleuroparenchymal scarring. No acute osseous abnormalities. IMPRESSION: 1. Right lung scarring, unchanged from 2011 chest CT. 2. No acute findings. Electronically Signed   By: Jeb Levering M.D.   On: 04/10/2018 22:51    Procedures Procedures (including critical care time)  Medications Ordered in ED Medications - No data to display   Initial Impression / Assessment and Plan / ED Course  I have reviewed the triage vital signs and the nursing notes.  Pertinent labs & imaging results that were available during my care of the patient were reviewed by me and considered in my medical decision making (see chart for details).     77 yo F with a chief complaint of palpitations and lightheadedness.  This is an ongoing issue for her.  Her  troponin is negative, EKG unremarkable.  Labs otherwise reassuring.  Patient history seems consistent with SVT, she is not currently having the symptoms now.  We will have her follow-up with the cardiologist in the office where they can hopefully get a Holter monitor that does not require cell phone coverage.  I offered to start her on a low-dose beta-blocker but she has tried this multiple times in the past and states that it lowers her blood pressure.   6:29 AM:  I have discussed the diagnosis/risks/treatment options with the patient and believe the pt to be eligible for discharge home to follow-up with Cards. We also discussed returning to the ED immediately if new or worsening sx occur. We discussed the sx which are most concerning (e.g., sudden worsening pain, fever, inability to tolerate by mouth) that necessitate immediate return. Medications administered to the patient during their visit and any new prescriptions provided to the patient are listed below.  Medications given during this visit Medications - No data to display   The patient appears reasonably screen and/or stabilized for discharge and I  doubt any other medical condition or other [facility] requiring further screening, evaluation, or treatment in the ED at this time prior to discharge.    Final Clinical Impressions(s) / ED Diagnoses   Final diagnoses:  Palpitations    ED Discharge Orders    None       Deno Etienne, DO 04/11/18 0630

## 2018-04-13 DIAGNOSIS — J069 Acute upper respiratory infection, unspecified: Secondary | ICD-10-CM | POA: Diagnosis not present

## 2018-04-30 DIAGNOSIS — M5432 Sciatica, left side: Secondary | ICD-10-CM | POA: Diagnosis not present

## 2018-05-01 DIAGNOSIS — M5127 Other intervertebral disc displacement, lumbosacral region: Secondary | ICD-10-CM | POA: Diagnosis not present

## 2018-05-01 DIAGNOSIS — M419 Scoliosis, unspecified: Secondary | ICD-10-CM | POA: Diagnosis not present

## 2018-05-01 DIAGNOSIS — M5126 Other intervertebral disc displacement, lumbar region: Secondary | ICD-10-CM | POA: Diagnosis not present

## 2018-05-01 DIAGNOSIS — M5136 Other intervertebral disc degeneration, lumbar region: Secondary | ICD-10-CM | POA: Diagnosis not present

## 2018-05-01 DIAGNOSIS — M545 Low back pain: Secondary | ICD-10-CM | POA: Diagnosis not present

## 2018-05-07 ENCOUNTER — Other Ambulatory Visit: Payer: Self-pay | Admitting: Internal Medicine

## 2018-05-07 DIAGNOSIS — M545 Low back pain: Principal | ICD-10-CM

## 2018-05-07 DIAGNOSIS — G8929 Other chronic pain: Secondary | ICD-10-CM

## 2018-05-08 ENCOUNTER — Other Ambulatory Visit: Payer: Self-pay | Admitting: Internal Medicine

## 2018-05-08 ENCOUNTER — Ambulatory Visit
Admission: RE | Admit: 2018-05-08 | Discharge: 2018-05-08 | Disposition: A | Discharge status: Home / Self Care | Payer: Medicare Other | Source: Ambulatory Visit | Attending: Internal Medicine | Admitting: Internal Medicine

## 2018-05-08 DIAGNOSIS — M47817 Spondylosis without myelopathy or radiculopathy, lumbosacral region: Secondary | ICD-10-CM | POA: Diagnosis not present

## 2018-05-08 DIAGNOSIS — G8929 Other chronic pain: Secondary | ICD-10-CM

## 2018-05-08 DIAGNOSIS — M545 Low back pain: Principal | ICD-10-CM

## 2018-05-08 MED ORDER — IOPAMIDOL (ISOVUE-M 200) INJECTION 41%
1.0000 mL | Freq: Once | INTRAMUSCULAR | Status: AC
  Administered 2018-05-08: 1 mL via EPIDURAL

## 2018-05-08 MED ORDER — METHYLPREDNISOLONE ACETATE 40 MG/ML INJ SUSP (RADIOLOG
120.0000 mg | Freq: Once | INTRAMUSCULAR | Status: AC
  Administered 2018-05-08: 120 mg via EPIDURAL

## 2018-05-08 NOTE — Discharge Instructions (Signed)

## 2018-05-21 DIAGNOSIS — I10 Essential (primary) hypertension: Secondary | ICD-10-CM | POA: Diagnosis not present

## 2018-05-21 DIAGNOSIS — M545 Low back pain: Secondary | ICD-10-CM | POA: Diagnosis not present

## 2018-05-21 DIAGNOSIS — Z1389 Encounter for screening for other disorder: Secondary | ICD-10-CM | POA: Diagnosis not present

## 2018-05-21 DIAGNOSIS — Z0000 Encounter for general adult medical examination without abnormal findings: Secondary | ICD-10-CM | POA: Diagnosis not present

## 2018-06-17 DIAGNOSIS — E782 Mixed hyperlipidemia: Secondary | ICD-10-CM | POA: Diagnosis not present

## 2018-06-17 DIAGNOSIS — I351 Nonrheumatic aortic (valve) insufficiency: Secondary | ICD-10-CM | POA: Diagnosis not present

## 2018-06-17 DIAGNOSIS — R002 Palpitations: Secondary | ICD-10-CM | POA: Diagnosis not present

## 2018-07-07 DIAGNOSIS — R002 Palpitations: Secondary | ICD-10-CM | POA: Diagnosis not present

## 2018-07-21 DIAGNOSIS — F418 Other specified anxiety disorders: Secondary | ICD-10-CM | POA: Diagnosis not present

## 2018-07-21 DIAGNOSIS — M545 Low back pain: Secondary | ICD-10-CM | POA: Diagnosis not present

## 2018-07-21 DIAGNOSIS — I10 Essential (primary) hypertension: Secondary | ICD-10-CM | POA: Diagnosis not present

## 2018-08-28 DIAGNOSIS — R202 Paresthesia of skin: Secondary | ICD-10-CM | POA: Diagnosis not present

## 2018-08-28 DIAGNOSIS — M549 Dorsalgia, unspecified: Secondary | ICD-10-CM | POA: Diagnosis not present

## 2018-08-28 DIAGNOSIS — R200 Anesthesia of skin: Secondary | ICD-10-CM | POA: Diagnosis not present

## 2018-09-03 DIAGNOSIS — R200 Anesthesia of skin: Secondary | ICD-10-CM | POA: Diagnosis not present

## 2018-09-03 DIAGNOSIS — M4726 Other spondylosis with radiculopathy, lumbar region: Secondary | ICD-10-CM | POA: Diagnosis not present

## 2018-09-03 DIAGNOSIS — R202 Paresthesia of skin: Secondary | ICD-10-CM | POA: Diagnosis not present

## 2018-09-03 DIAGNOSIS — Z23 Encounter for immunization: Secondary | ICD-10-CM | POA: Diagnosis not present

## 2018-09-03 DIAGNOSIS — M549 Dorsalgia, unspecified: Secondary | ICD-10-CM | POA: Diagnosis not present

## 2018-09-03 DIAGNOSIS — M6281 Muscle weakness (generalized): Secondary | ICD-10-CM | POA: Diagnosis not present

## 2018-09-08 DIAGNOSIS — R202 Paresthesia of skin: Secondary | ICD-10-CM | POA: Diagnosis not present

## 2018-09-08 DIAGNOSIS — M4726 Other spondylosis with radiculopathy, lumbar region: Secondary | ICD-10-CM | POA: Diagnosis not present

## 2018-09-08 DIAGNOSIS — R200 Anesthesia of skin: Secondary | ICD-10-CM | POA: Diagnosis not present

## 2018-09-08 DIAGNOSIS — M6281 Muscle weakness (generalized): Secondary | ICD-10-CM | POA: Diagnosis not present

## 2018-09-08 DIAGNOSIS — M549 Dorsalgia, unspecified: Secondary | ICD-10-CM | POA: Diagnosis not present

## 2018-09-14 DIAGNOSIS — M545 Low back pain: Secondary | ICD-10-CM | POA: Diagnosis not present

## 2018-09-14 DIAGNOSIS — I10 Essential (primary) hypertension: Secondary | ICD-10-CM | POA: Diagnosis not present

## 2018-09-14 DIAGNOSIS — F418 Other specified anxiety disorders: Secondary | ICD-10-CM | POA: Diagnosis not present

## 2018-09-15 DIAGNOSIS — M549 Dorsalgia, unspecified: Secondary | ICD-10-CM | POA: Diagnosis not present

## 2018-09-15 DIAGNOSIS — R200 Anesthesia of skin: Secondary | ICD-10-CM | POA: Diagnosis not present

## 2018-09-15 DIAGNOSIS — R202 Paresthesia of skin: Secondary | ICD-10-CM | POA: Diagnosis not present

## 2018-09-15 DIAGNOSIS — M4726 Other spondylosis with radiculopathy, lumbar region: Secondary | ICD-10-CM | POA: Diagnosis not present

## 2018-09-15 DIAGNOSIS — M6281 Muscle weakness (generalized): Secondary | ICD-10-CM | POA: Diagnosis not present

## 2018-09-17 DIAGNOSIS — R202 Paresthesia of skin: Secondary | ICD-10-CM | POA: Diagnosis not present

## 2018-09-17 DIAGNOSIS — M549 Dorsalgia, unspecified: Secondary | ICD-10-CM | POA: Diagnosis not present

## 2018-09-17 DIAGNOSIS — M4726 Other spondylosis with radiculopathy, lumbar region: Secondary | ICD-10-CM | POA: Diagnosis not present

## 2018-09-17 DIAGNOSIS — R200 Anesthesia of skin: Secondary | ICD-10-CM | POA: Diagnosis not present

## 2018-09-17 DIAGNOSIS — M6281 Muscle weakness (generalized): Secondary | ICD-10-CM | POA: Diagnosis not present

## 2018-09-22 DIAGNOSIS — Z85828 Personal history of other malignant neoplasm of skin: Secondary | ICD-10-CM | POA: Diagnosis not present

## 2018-09-22 DIAGNOSIS — L570 Actinic keratosis: Secondary | ICD-10-CM | POA: Diagnosis not present

## 2018-09-22 DIAGNOSIS — Z85820 Personal history of malignant melanoma of skin: Secondary | ICD-10-CM | POA: Diagnosis not present

## 2018-09-23 DIAGNOSIS — M4726 Other spondylosis with radiculopathy, lumbar region: Secondary | ICD-10-CM | POA: Diagnosis not present

## 2018-09-23 DIAGNOSIS — M6281 Muscle weakness (generalized): Secondary | ICD-10-CM | POA: Diagnosis not present

## 2018-09-23 DIAGNOSIS — M549 Dorsalgia, unspecified: Secondary | ICD-10-CM | POA: Diagnosis not present

## 2018-09-23 DIAGNOSIS — R200 Anesthesia of skin: Secondary | ICD-10-CM | POA: Diagnosis not present

## 2018-09-23 DIAGNOSIS — R202 Paresthesia of skin: Secondary | ICD-10-CM | POA: Diagnosis not present

## 2018-09-29 DIAGNOSIS — R202 Paresthesia of skin: Secondary | ICD-10-CM | POA: Diagnosis not present

## 2018-09-29 DIAGNOSIS — M549 Dorsalgia, unspecified: Secondary | ICD-10-CM | POA: Diagnosis not present

## 2018-09-29 DIAGNOSIS — M4726 Other spondylosis with radiculopathy, lumbar region: Secondary | ICD-10-CM | POA: Diagnosis not present

## 2018-09-29 DIAGNOSIS — R200 Anesthesia of skin: Secondary | ICD-10-CM | POA: Diagnosis not present

## 2018-09-29 DIAGNOSIS — M6281 Muscle weakness (generalized): Secondary | ICD-10-CM | POA: Diagnosis not present

## 2018-10-01 DIAGNOSIS — M4726 Other spondylosis with radiculopathy, lumbar region: Secondary | ICD-10-CM | POA: Diagnosis not present

## 2018-10-01 DIAGNOSIS — R200 Anesthesia of skin: Secondary | ICD-10-CM | POA: Diagnosis not present

## 2018-10-01 DIAGNOSIS — R202 Paresthesia of skin: Secondary | ICD-10-CM | POA: Diagnosis not present

## 2018-10-01 DIAGNOSIS — M6281 Muscle weakness (generalized): Secondary | ICD-10-CM | POA: Diagnosis not present

## 2018-10-01 DIAGNOSIS — M549 Dorsalgia, unspecified: Secondary | ICD-10-CM | POA: Diagnosis not present

## 2018-10-08 DIAGNOSIS — R200 Anesthesia of skin: Secondary | ICD-10-CM | POA: Diagnosis not present

## 2018-10-08 DIAGNOSIS — M549 Dorsalgia, unspecified: Secondary | ICD-10-CM | POA: Diagnosis not present

## 2018-10-08 DIAGNOSIS — M4726 Other spondylosis with radiculopathy, lumbar region: Secondary | ICD-10-CM | POA: Diagnosis not present

## 2018-10-08 DIAGNOSIS — R202 Paresthesia of skin: Secondary | ICD-10-CM | POA: Diagnosis not present

## 2018-10-08 DIAGNOSIS — M6281 Muscle weakness (generalized): Secondary | ICD-10-CM | POA: Diagnosis not present

## 2018-10-12 DIAGNOSIS — A310 Pulmonary mycobacterial infection: Secondary | ICD-10-CM | POA: Diagnosis not present

## 2018-10-12 DIAGNOSIS — R918 Other nonspecific abnormal finding of lung field: Secondary | ICD-10-CM | POA: Diagnosis not present

## 2018-10-12 DIAGNOSIS — Z79899 Other long term (current) drug therapy: Secondary | ICD-10-CM | POA: Diagnosis not present

## 2018-10-12 DIAGNOSIS — Z87891 Personal history of nicotine dependence: Secondary | ICD-10-CM | POA: Diagnosis not present

## 2018-10-12 DIAGNOSIS — A319 Mycobacterial infection, unspecified: Secondary | ICD-10-CM | POA: Diagnosis not present

## 2018-10-13 DIAGNOSIS — R202 Paresthesia of skin: Secondary | ICD-10-CM | POA: Diagnosis not present

## 2018-10-13 DIAGNOSIS — M549 Dorsalgia, unspecified: Secondary | ICD-10-CM | POA: Diagnosis not present

## 2018-10-13 DIAGNOSIS — M6281 Muscle weakness (generalized): Secondary | ICD-10-CM | POA: Diagnosis not present

## 2018-10-13 DIAGNOSIS — M4726 Other spondylosis with radiculopathy, lumbar region: Secondary | ICD-10-CM | POA: Diagnosis not present

## 2018-10-13 DIAGNOSIS — R200 Anesthesia of skin: Secondary | ICD-10-CM | POA: Diagnosis not present

## 2018-10-15 ENCOUNTER — Ambulatory Visit: Payer: Medicare Other | Admitting: Neurology

## 2018-10-15 ENCOUNTER — Encounter

## 2018-10-26 DIAGNOSIS — M545 Low back pain: Secondary | ICD-10-CM | POA: Diagnosis not present

## 2018-10-26 DIAGNOSIS — F418 Other specified anxiety disorders: Secondary | ICD-10-CM | POA: Diagnosis not present

## 2018-10-26 DIAGNOSIS — I10 Essential (primary) hypertension: Secondary | ICD-10-CM | POA: Diagnosis not present

## 2018-10-26 DIAGNOSIS — R7303 Prediabetes: Secondary | ICD-10-CM | POA: Diagnosis not present

## 2018-10-27 DIAGNOSIS — D3131 Benign neoplasm of right choroid: Secondary | ICD-10-CM | POA: Diagnosis not present

## 2018-11-03 DIAGNOSIS — M5126 Other intervertebral disc displacement, lumbar region: Secondary | ICD-10-CM | POA: Diagnosis not present

## 2018-11-03 DIAGNOSIS — R030 Elevated blood-pressure reading, without diagnosis of hypertension: Secondary | ICD-10-CM | POA: Diagnosis not present

## 2018-11-03 DIAGNOSIS — M5416 Radiculopathy, lumbar region: Secondary | ICD-10-CM | POA: Diagnosis not present

## 2018-11-03 DIAGNOSIS — M545 Low back pain: Secondary | ICD-10-CM | POA: Diagnosis not present

## 2018-11-10 DIAGNOSIS — F418 Other specified anxiety disorders: Secondary | ICD-10-CM | POA: Diagnosis not present

## 2018-11-10 DIAGNOSIS — I10 Essential (primary) hypertension: Secondary | ICD-10-CM | POA: Diagnosis not present

## 2018-11-10 DIAGNOSIS — M545 Low back pain: Secondary | ICD-10-CM | POA: Diagnosis not present

## 2018-11-10 DIAGNOSIS — K2960 Other gastritis without bleeding: Secondary | ICD-10-CM | POA: Diagnosis not present

## 2018-11-25 ENCOUNTER — Ambulatory Visit (INDEPENDENT_AMBULATORY_CARE_PROVIDER_SITE_OTHER): Payer: Medicare Other | Admitting: Internal Medicine

## 2018-11-25 ENCOUNTER — Encounter (INDEPENDENT_AMBULATORY_CARE_PROVIDER_SITE_OTHER): Payer: Self-pay | Admitting: Internal Medicine

## 2018-11-25 DIAGNOSIS — R109 Unspecified abdominal pain: Secondary | ICD-10-CM | POA: Insufficient documentation

## 2018-11-25 DIAGNOSIS — R1010 Upper abdominal pain, unspecified: Secondary | ICD-10-CM

## 2018-11-25 HISTORY — DX: Unspecified abdominal pain: R10.9

## 2018-11-25 NOTE — Patient Instructions (Signed)
Labs and CT.  

## 2018-11-25 NOTE — Progress Notes (Signed)
   Subjective:    Patient ID: Suzanne Davis, female    DOB: 18-Feb-1941, 77 y.o.   MRN: [id number]  HPI Referred by Dr. Sherrie Sport for gastritis.She tells me she has pain under her rib cage.  She says the pain is not related to eating.  When she eats or burps she has pain in her left breast. She has had symptoms for about a year.  She does not have gallbladder. Very little acid reflux and Tums as needed. Her appetite is good. No weight loss.  In office today, there is no pain.  When she has the pain, it will last 30 minutes to an hour.      Last colonoscopy in 2014 by Dr. Britta Mccreedy which showed mild diverticulosis, 2 polyps. Biopsy: tubular adenoma.    Review of Systems Past Medical History:  Diagnosis Date  . Abdominal pain 11/25/2018  . Sciatica     Past Surgical History:  Procedure Laterality Date  . CHOLECYSTECTOMY    . colonoscopy 2017      Allergies  Allergen Reactions  . Ethambutol Other (See Comments)    High Fever--confirmed with sequential rechallenge  . Tetracycline Hives    Current Outpatient Medications on File Prior to Visit  Medication Sig Dispense Refill  . Ascorbic Acid (VITAMIN C WITH ROSE HIPS) 500 MG tablet Take 500 mg by mouth daily.    Marland Kitchen aspirin 81 MG chewable tablet Chew by mouth daily.    . Biotin 10000 MCG TABS Take by mouth daily.    Marland Kitchen CALCIUM-MAGNESIUM-ZINC PO Take by mouth daily.    . Cholecalciferol (VITAMIN D3) 10 MCG (400 UNIT) CAPS Take 2,000 Units by mouth daily.    . Coenzyme Q10 (COQ10) 200 MG CAPS Take by mouth.    Marland Kitchen LORazepam (ATIVAN) 1 MG tablet Take 1 mg by mouth at bedtime.    . Multiple Vitamin (MULTIVITAMIN) tablet Take 1 tablet by mouth daily.    . Omega-3 Fatty Acids (FISH OIL) 1000 MG CPDR Take by mouth.    . oxyCODONE (OXYCONTIN) 20 mg 12 hr tablet Take 20 mg by mouth every 12 (twelve) hours.    . vitamin E (VITAMIN E) 400 UNIT capsule Take 400 Units by mouth daily.     No current facility-administered medications on file prior  to visit.                  Objective:   Physical Exam  Blood pressure (!) 160/72, pulse 72, temperature (!) 97.4 F (36.3 C), height 5\' 9"  (1.753 m), weight 135 lb 3.2 oz (61.3 kg). Alert and oriented. Skin warm and dry. Oral mucosa is moist.   . Sclera anicteric, conjunctivae is pink. Thyroid not enlarged. No cervical lymphadenopathy. Lungs clear. Heart regular rate and rhythm.  Abdomen is soft. Bowel sounds are positive. No hepatomegaly. No abdominal masses felt. No tenderness.  No edema to lower extremities.          Assessment & Plan:  Epigastric pain ( rt upper quadrant). Am going to get a CBC an CMET. CT abdomen/pelvis with CM.

## 2018-12-28 DIAGNOSIS — Z0189 Encounter for other specified special examinations: Secondary | ICD-10-CM | POA: Diagnosis not present

## 2018-12-28 DIAGNOSIS — I351 Nonrheumatic aortic (valve) insufficiency: Secondary | ICD-10-CM | POA: Diagnosis not present

## 2018-12-28 DIAGNOSIS — E782 Mixed hyperlipidemia: Secondary | ICD-10-CM | POA: Diagnosis not present

## 2018-12-28 DIAGNOSIS — E0590 Thyrotoxicosis, unspecified without thyrotoxic crisis or storm: Secondary | ICD-10-CM | POA: Diagnosis not present

## 2018-12-28 DIAGNOSIS — I471 Supraventricular tachycardia: Secondary | ICD-10-CM | POA: Diagnosis not present

## 2019-01-11 DIAGNOSIS — F418 Other specified anxiety disorders: Secondary | ICD-10-CM | POA: Diagnosis not present

## 2019-01-11 DIAGNOSIS — M545 Low back pain: Secondary | ICD-10-CM | POA: Diagnosis not present

## 2019-01-11 DIAGNOSIS — I10 Essential (primary) hypertension: Secondary | ICD-10-CM | POA: Diagnosis not present

## 2019-01-14 ENCOUNTER — Other Ambulatory Visit (HOSPITAL_COMMUNITY): Payer: Self-pay | Admitting: Internal Medicine

## 2019-01-14 DIAGNOSIS — N63 Unspecified lump in unspecified breast: Secondary | ICD-10-CM

## 2019-01-27 DIAGNOSIS — R1010 Upper abdominal pain, unspecified: Secondary | ICD-10-CM | POA: Diagnosis not present

## 2019-01-28 LAB — COMPREHENSIVE METABOLIC PANEL
AG RATIO: 1.8 (calc) (ref 1.0–2.5)
ALKALINE PHOSPHATASE (APISO): 87 U/L (ref 33–130)
ALT: 15 U/L (ref 6–29)
AST: 7 U/L — AB (ref 10–35)
Albumin: 4.2 g/dL (ref 3.6–5.1)
BILIRUBIN TOTAL: 0.4 mg/dL (ref 0.2–1.2)
BUN / CREAT RATIO: 38 (calc) — AB (ref 6–22)
BUN: 21 mg/dL (ref 7–25)
CALCIUM: 10.2 mg/dL (ref 8.6–10.4)
CHLORIDE: 103 mmol/L (ref 98–110)
CO2: 29 mmol/L (ref 20–32)
Creat: 0.56 mg/dL — ABNORMAL LOW (ref 0.60–0.93)
GLOBULIN: 2.4 g/dL (ref 1.9–3.7)
Glucose, Bld: 78 mg/dL (ref 65–139)
Potassium: 5.1 mmol/L (ref 3.5–5.3)
Sodium: 141 mmol/L (ref 135–146)
Total Protein: 6.6 g/dL (ref 6.1–8.1)

## 2019-01-28 LAB — CBC WITH DIFFERENTIAL/PLATELET
Absolute Monocytes: 528 cells/uL (ref 200–950)
BASOS ABS: 26 {cells}/uL (ref 0–200)
Basophils Relative: 0.3 %
EOS PCT: 0.7 %
Eosinophils Absolute: 62 cells/uL (ref 15–500)
HEMATOCRIT: 40.1 % (ref 35.0–45.0)
HEMOGLOBIN: 13.2 g/dL (ref 11.7–15.5)
LYMPHS ABS: 1883 {cells}/uL (ref 850–3900)
MCH: 29.6 pg (ref 27.0–33.0)
MCHC: 32.9 g/dL (ref 32.0–36.0)
MCV: 89.9 fL (ref 80.0–100.0)
MPV: 11.5 fL (ref 7.5–12.5)
Monocytes Relative: 6 %
NEUTROS ABS: 6301 {cells}/uL (ref 1500–7800)
Neutrophils Relative %: 71.6 %
Platelets: 298 10*3/uL (ref 140–400)
RBC: 4.46 10*6/uL (ref 3.80–5.10)
RDW: 12 % (ref 11.0–15.0)
Total Lymphocyte: 21.4 %
WBC: 8.8 10*3/uL (ref 3.8–10.8)

## 2019-02-01 ENCOUNTER — Ambulatory Visit (HOSPITAL_COMMUNITY)
Admission: RE | Admit: 2019-02-01 | Discharge: 2019-02-01 | Disposition: A | Discharge status: Home / Self Care | Payer: Medicare Other | Source: Ambulatory Visit | Attending: Internal Medicine | Admitting: Internal Medicine

## 2019-02-01 DIAGNOSIS — R1010 Upper abdominal pain, unspecified: Secondary | ICD-10-CM | POA: Insufficient documentation

## 2019-02-01 DIAGNOSIS — K7689 Other specified diseases of liver: Secondary | ICD-10-CM | POA: Diagnosis not present

## 2019-02-01 DIAGNOSIS — N281 Cyst of kidney, acquired: Secondary | ICD-10-CM | POA: Diagnosis not present

## 2019-02-01 MED ORDER — IOPAMIDOL (ISOVUE-300) INJECTION 61%: 100.0000 mL | Freq: Once | INTRAVENOUS | Status: DC | PRN

## 2019-02-01 MED ORDER — IOHEXOL 300 MG/ML  SOLN
100.0000 mL | Freq: Once | INTRAMUSCULAR | Status: AC | PRN
  Administered 2019-02-01: 100 mL via INTRAVENOUS

## 2019-02-04 ENCOUNTER — Telehealth (INDEPENDENT_AMBULATORY_CARE_PROVIDER_SITE_OTHER): Payer: Self-pay | Admitting: *Deleted

## 2019-02-04 ENCOUNTER — Telehealth (INDEPENDENT_AMBULATORY_CARE_PROVIDER_SITE_OTHER): Payer: Self-pay | Admitting: Internal Medicine

## 2019-02-04 DIAGNOSIS — R918 Other nonspecific abnormal finding of lung field: Secondary | ICD-10-CM

## 2019-02-04 NOTE — Telephone Encounter (Signed)
err

## 2019-02-04 NOTE — Telephone Encounter (Signed)
Suzanne Davis, CT chest with CM

## 2019-02-04 NOTE — Telephone Encounter (Signed)
Please call patient regarding CT scan - ph# [phone]

## 2019-02-04 NOTE — Telephone Encounter (Signed)
Patient would like you to call her to discuss CT results -- she can't remember all you told her

## 2019-02-04 NOTE — Addendum Note (Signed)
Addended by: Butch Penny on: 02/04/2019 02:53 PM   Modules accepted: Orders

## 2019-02-05 NOTE — Telephone Encounter (Signed)
No answer. Will call Monday

## 2019-02-08 NOTE — Telephone Encounter (Signed)
I will address with her.

## 2019-02-08 NOTE — Telephone Encounter (Signed)
This needs to be addressed by Ms. Setzer. I called patient while she was away. Patient told me that she had abnormal CT before.

## 2019-02-08 NOTE — Telephone Encounter (Signed)
Message left on her answering machine 

## 2019-02-08 NOTE — Telephone Encounter (Signed)
I have spoken with patient 

## 2019-02-09 ENCOUNTER — Ambulatory Visit (HOSPITAL_COMMUNITY)
Admission: RE | Admit: 2019-02-09 | Discharge: 2019-02-09 | Disposition: A | Discharge status: Home / Self Care | Payer: Medicare Other | Source: Ambulatory Visit | Attending: Internal Medicine | Admitting: Internal Medicine

## 2019-02-09 ENCOUNTER — Telehealth (INDEPENDENT_AMBULATORY_CARE_PROVIDER_SITE_OTHER): Payer: Self-pay | Admitting: Internal Medicine

## 2019-02-09 ENCOUNTER — Ambulatory Visit (HOSPITAL_COMMUNITY): Payer: Medicare Other

## 2019-02-09 DIAGNOSIS — R922 Inconclusive mammogram: Secondary | ICD-10-CM | POA: Diagnosis not present

## 2019-02-09 DIAGNOSIS — N6489 Other specified disorders of breast: Secondary | ICD-10-CM | POA: Diagnosis not present

## 2019-02-09 DIAGNOSIS — N63 Unspecified lump in unspecified breast: Secondary | ICD-10-CM | POA: Diagnosis not present

## 2019-02-09 DIAGNOSIS — R918 Other nonspecific abnormal finding of lung field: Secondary | ICD-10-CM

## 2019-02-09 NOTE — Telephone Encounter (Signed)
Chest CT sch'd 02/24/19 at 1 (1245), npo 4 hrs,left detailed message for patient

## 2019-02-09 NOTE — Telephone Encounter (Signed)
Ann CT chest with CM. Patient is aware.

## 2019-02-24 ENCOUNTER — Ambulatory Visit (HOSPITAL_COMMUNITY)
Admission: RE | Admit: 2019-02-24 | Discharge: 2019-02-24 | Disposition: A | Discharge status: Home / Self Care | Payer: Medicare Other | Source: Ambulatory Visit | Attending: Internal Medicine | Admitting: Internal Medicine

## 2019-02-24 DIAGNOSIS — R918 Other nonspecific abnormal finding of lung field: Secondary | ICD-10-CM | POA: Diagnosis not present

## 2019-02-24 MED ORDER — IOHEXOL 300 MG/ML  SOLN
75.0000 mL | Freq: Once | INTRAMUSCULAR | Status: AC | PRN
  Administered 2019-02-24: 75 mL via INTRAVENOUS

## 2019-02-25 ENCOUNTER — Telehealth (INDEPENDENT_AMBULATORY_CARE_PROVIDER_SITE_OTHER): Payer: Self-pay | Admitting: Internal Medicine

## 2019-02-25 NOTE — Telephone Encounter (Signed)
Patient called back stated she is traveling - call her at [phone]

## 2019-02-25 NOTE — Telephone Encounter (Signed)
Patient would like to discuss her CT scans with you - ph# [phone]

## 2019-03-01 NOTE — Telephone Encounter (Signed)
Results given to patient again. She has appt with her PCP 06/11/2019.

## 2019-03-03 DIAGNOSIS — A319 Mycobacterial infection, unspecified: Secondary | ICD-10-CM | POA: Diagnosis not present

## 2019-03-03 DIAGNOSIS — R002 Palpitations: Secondary | ICD-10-CM | POA: Diagnosis not present

## 2019-03-03 DIAGNOSIS — R05 Cough: Secondary | ICD-10-CM | POA: Diagnosis not present

## 2019-03-05 DIAGNOSIS — R002 Palpitations: Secondary | ICD-10-CM | POA: Diagnosis not present

## 2019-03-11 ENCOUNTER — Ambulatory Visit (INDEPENDENT_AMBULATORY_CARE_PROVIDER_SITE_OTHER): Payer: Medicare Other | Admitting: Internal Medicine

## 2019-03-11 ENCOUNTER — Encounter: Payer: Self-pay | Admitting: Internal Medicine

## 2019-03-11 ENCOUNTER — Other Ambulatory Visit: Payer: Self-pay

## 2019-03-11 DIAGNOSIS — J302 Other seasonal allergic rhinitis: Secondary | ICD-10-CM

## 2019-03-11 DIAGNOSIS — R911 Solitary pulmonary nodule: Secondary | ICD-10-CM | POA: Insufficient documentation

## 2019-03-11 DIAGNOSIS — R918 Other nonspecific abnormal finding of lung field: Secondary | ICD-10-CM | POA: Insufficient documentation

## 2019-03-11 DIAGNOSIS — Z8619 Personal history of other infectious and parasitic diseases: Secondary | ICD-10-CM | POA: Insufficient documentation

## 2019-03-11 NOTE — Progress Notes (Signed)
[facility]  Reason for Consult: History of Mycobacterium avium infection Referring Provider: Dr. Stoney Bang  Assessment: She has a right upper lobe nodule that has been present for at least 15 years and apparently has enlarged gradually.  She also has a remote history of Mycobacterium avium isolated from sputum.  I certainly am not certain that these 2 things are directly related.  I certainly would recommend consideration of repeat PET scan given her risk for lung cancer.  I will see if she can submit a sputum specimen for repeat AFB culture.  She certainly does not have symptoms to suggest active pneumonia and her history suggests that she may not do well tolerating future therapy for Mycobacterium avium.  I do not think that her intermittent epigastric pain has anything to do with Mycobacterium avium.  Plan: 1. Recommend considering PET scan 2. Sputum AFB stain and culture 3. Follow-up here in 6 weeks  Patient Active Problem List   Diagnosis Date Noted  . 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 Right upper lobe pulmonary nodule 03/11/2019    Priority: High  . Abdominal pain 11/25/2018    Priority: Medium  . Seasonal allergies 03/11/2019  . BENIGN NEOPLASM OF EYE PART UNSPECIFIED 08/08/2009  . GOITER, UNSPECIFIED 08/08/2009  . DYSLIPIDEMIA 08/08/2009  . ESOPHAGEAL REFLUX 08/08/2009  . 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 PERS HX TOBACCO USE PRESENTING HAZARDS HEALTH 08/08/2009    Patient's Medications  New Prescriptions   No medications on file  Previous Medications   ASCORBIC ACID (VITAMIN C WITH ROSE HIPS) 500 MG TABLET    Take 500 mg by mouth daily.   ASPIRIN 81 MG CHEWABLE TABLET    Chew by mouth daily.   ATENOLOL (TENORMIN) 25 MG TABLET    Take 25 mg by mouth daily.   BIOTIN 32023 MCG TABS    Take by mouth daily.   CALCIUM-MAGNESIUM-ZINC PO    Take by mouth daily.    CHOLECALCIFEROL (VITAMIN D3) 10 MCG (400 UNIT) CAPS    Take 2,000 Units by mouth daily.   COENZYME Q10 (COQ10) 200 MG CAPS    Take by mouth.   LORAZEPAM (ATIVAN) 1 MG TABLET    Take 1 mg by mouth at bedtime.   MULTIPLE VITAMIN (MULTIVITAMIN) TABLET    Take 1 tablet by mouth daily.   OMEGA-3 FATTY ACIDS (FISH OIL) 1000 MG CPDR    Take by mouth.   OXYCODONE (OXYCONTIN) 20 MG 12 HR TABLET    Take 20 mg by mouth every 12 (twelve) hours.   VITAMIN E (VITAMIN E) 400 UNIT CAPSULE    Take 400 Units by mouth daily.  Modified Medications   No medications on file  Discontinued Medications   No medications on file    HPI: Suzanne Davis is a 78 y.o. female former smoker who has had right upper lobe nodule since at least 2005.  In 2012 she underwent bronchoscopy and cultures grew a macrolide sensitive Mycobacterium avium.  She was started on azithromycin, ethambutol and rifampin.  She immediately began to have problems with severe insomnia and anorexia.  She developed fever.  She stopped all 3 medications after 2 weeks.  She was rechallenged with ethambutol alone and immediately developed high fever again.  It was stopped and she did not resume any treatment after that time.  She was seen by Dr. Brigitte Pulse at  [facility] on several occasions over the past few years.  She has had repeat scans which showed the right upper lobe nodules.  Dr. Lorenda Cahill did not feel that she had symptoms to warrant repeat treatment and she did not want any treatment.    She has had intermittent epigastric discomfort over the past several years.  She has been concerned that the pain might be related to Mycobacterium avium infection.  Dr. Romie Levee note from last October indicates that he told her he did not think the 2 were related.  She has mild stable cough occasionally productive of sputum.  She has mild dyspnea on exertion which is also stable.  She lost a few pounds after she retired in 2017 but has not continued to  lose weight.  Her appetite is good.  She has not had any fever, chills or sweats.  Repeat CT scan done recently showed the right upper lobe nodule and indicated that it had progressed since 2011.  She tells me that she had a PET scan done several years ago and was told that it was normal.  Review of Systems: Review of Systems  Constitutional: Negative for chills, diaphoresis, fever, malaise/fatigue and weight loss.  HENT: Positive for congestion. Negative for sore throat.   Respiratory: Positive for cough, sputum production and shortness of breath. Negative for hemoptysis.   Cardiovascular: Negative for chest pain.  Gastrointestinal: Positive for heartburn. Negative for abdominal pain, diarrhea, nausea and vomiting.       Intermittent epigastric discomfort as noted in HPI.      Past Medical History:  Diagnosis Date  . Abdominal pain 11/25/2018  . Sciatica     Social History   Tobacco Use  . Smoking status: Never Smoker  . Smokeless tobacco: Never Used  Substance Use Topics  . Alcohol use: Never    Frequency: Never  . Drug use: Never    No family history on file. Allergies  Allergen Reactions  . Ethambutol Other (See Comments)    High Fever--confirmed with sequential rechallenge  . Tetracycline Hives    OBJECTIVE: Vitals:   03/11/19 1508  BP: (!) 163/69  Pulse: 69  Temp: 99 F (37.2 C)  TempSrc: Oral  Weight: 133 lb (60.3 kg)   Body mass index is 19.64 kg/m.   Physical Exam Constitutional:      Comments: She is very pleasant and in no distress.  Cardiovascular:     Rate and Rhythm: Normal rate and regular rhythm.     Heart sounds: No murmur.  Pulmonary:     Effort: Pulmonary effort is normal.     Breath sounds: Normal breath sounds. No wheezing, rhonchi or rales.  Abdominal:     Palpations: Abdomen is soft.     Tenderness: There is no abdominal tenderness.  Psychiatric:        Mood and Affect: Mood normal.     Microbiology: No results found for  this or any previous visit (from the past 240 hour(s)).  Michel Bickers, MD [facility] [phone] pager   [phone] cell 03/11/2019, 5:12 PM

## 2019-03-15 DIAGNOSIS — F418 Other specified anxiety disorders: Secondary | ICD-10-CM | POA: Diagnosis not present

## 2019-03-15 DIAGNOSIS — I10 Essential (primary) hypertension: Secondary | ICD-10-CM | POA: Diagnosis not present

## 2019-03-15 DIAGNOSIS — I700 Atherosclerosis of aorta: Secondary | ICD-10-CM | POA: Diagnosis not present

## 2019-03-15 DIAGNOSIS — M545 Low back pain: Secondary | ICD-10-CM | POA: Diagnosis not present

## 2019-03-19 DIAGNOSIS — F418 Other specified anxiety disorders: Secondary | ICD-10-CM | POA: Diagnosis not present

## 2019-03-19 DIAGNOSIS — I10 Essential (primary) hypertension: Secondary | ICD-10-CM | POA: Diagnosis not present

## 2019-03-19 DIAGNOSIS — Z1159 Encounter for screening for other viral diseases: Secondary | ICD-10-CM | POA: Diagnosis not present

## 2019-03-19 DIAGNOSIS — I700 Atherosclerosis of aorta: Secondary | ICD-10-CM | POA: Diagnosis not present

## 2019-03-19 DIAGNOSIS — M545 Low back pain: Secondary | ICD-10-CM | POA: Diagnosis not present

## 2019-03-31 DIAGNOSIS — A319 Mycobacterial infection, unspecified: Secondary | ICD-10-CM | POA: Diagnosis not present

## 2019-03-31 DIAGNOSIS — R9389 Abnormal findings on diagnostic imaging of other specified body structures: Secondary | ICD-10-CM | POA: Diagnosis not present

## 2019-04-13 DIAGNOSIS — A319 Mycobacterial infection, unspecified: Secondary | ICD-10-CM | POA: Diagnosis not present

## 2019-04-27 ENCOUNTER — Ambulatory Visit: Payer: Medicare Other | Admitting: Internal Medicine

## 2019-05-13 DIAGNOSIS — I7090 Unspecified atherosclerosis: Secondary | ICD-10-CM | POA: Diagnosis not present

## 2019-05-13 DIAGNOSIS — I809 Phlebitis and thrombophlebitis of unspecified site: Secondary | ICD-10-CM | POA: Diagnosis not present

## 2019-05-13 DIAGNOSIS — R002 Palpitations: Secondary | ICD-10-CM | POA: Diagnosis not present

## 2019-05-13 DIAGNOSIS — A319 Mycobacterial infection, unspecified: Secondary | ICD-10-CM | POA: Diagnosis not present

## 2019-05-25 DIAGNOSIS — J471 Bronchiectasis with (acute) exacerbation: Secondary | ICD-10-CM | POA: Diagnosis not present

## 2019-05-25 DIAGNOSIS — A319 Mycobacterial infection, unspecified: Secondary | ICD-10-CM | POA: Diagnosis not present

## 2019-05-25 DIAGNOSIS — R9389 Abnormal findings on diagnostic imaging of other specified body structures: Secondary | ICD-10-CM | POA: Diagnosis not present

## 2019-05-25 DIAGNOSIS — R0789 Other chest pain: Secondary | ICD-10-CM | POA: Diagnosis not present

## 2019-06-15 DIAGNOSIS — I10 Essential (primary) hypertension: Secondary | ICD-10-CM | POA: Diagnosis not present

## 2019-06-15 DIAGNOSIS — F418 Other specified anxiety disorders: Secondary | ICD-10-CM | POA: Diagnosis not present

## 2019-06-15 DIAGNOSIS — Z0000 Encounter for general adult medical examination without abnormal findings: Secondary | ICD-10-CM | POA: Diagnosis not present

## 2019-06-15 DIAGNOSIS — I700 Atherosclerosis of aorta: Secondary | ICD-10-CM | POA: Diagnosis not present

## 2019-06-15 DIAGNOSIS — M545 Low back pain: Secondary | ICD-10-CM | POA: Diagnosis not present

## 2019-06-15 DIAGNOSIS — Z1389 Encounter for screening for other disorder: Secondary | ICD-10-CM | POA: Diagnosis not present

## 2019-07-14 ENCOUNTER — Other Ambulatory Visit: Payer: Self-pay | Admitting: Internal Medicine

## 2019-07-14 DIAGNOSIS — G8929 Other chronic pain: Secondary | ICD-10-CM

## 2019-07-14 DIAGNOSIS — M545 Low back pain, unspecified: Secondary | ICD-10-CM

## 2019-07-20 ENCOUNTER — Other Ambulatory Visit: Payer: Self-pay

## 2019-07-20 ENCOUNTER — Ambulatory Visit
Admission: RE | Admit: 2019-07-20 | Discharge: 2019-07-20 | Disposition: A | Discharge status: Home / Self Care | Payer: Medicare Other | Source: Ambulatory Visit | Attending: Internal Medicine | Admitting: Internal Medicine

## 2019-07-20 ENCOUNTER — Other Ambulatory Visit: Payer: Medicare Other

## 2019-07-20 ENCOUNTER — Other Ambulatory Visit: Payer: Self-pay | Admitting: Internal Medicine

## 2019-07-20 DIAGNOSIS — G8929 Other chronic pain: Secondary | ICD-10-CM

## 2019-07-20 DIAGNOSIS — Z8619 Personal history of other infectious and parasitic diseases: Secondary | ICD-10-CM | POA: Diagnosis not present

## 2019-07-20 DIAGNOSIS — M5416 Radiculopathy, lumbar region: Secondary | ICD-10-CM | POA: Diagnosis not present

## 2019-07-20 MED ORDER — IOPAMIDOL (ISOVUE-M 200) INJECTION 41%
1.0000 mL | Freq: Once | INTRAMUSCULAR | Status: AC
  Administered 2019-07-20: 1 mL via EPIDURAL

## 2019-07-20 MED ORDER — METHYLPREDNISOLONE ACETATE 40 MG/ML INJ SUSP (RADIOLOG
120.0000 mg | Freq: Once | INTRAMUSCULAR | Status: AC
  Administered 2019-07-20: 15:00:00 120 mg via EPIDURAL

## 2019-07-20 NOTE — Discharge Instructions (Signed)

## 2019-07-21 ENCOUNTER — Telehealth: Payer: Self-pay | Admitting: *Deleted

## 2019-07-21 NOTE — Telephone Encounter (Signed)
Returned call, left voicemail with upcoming appointment information. 7/28 3:30. Landis Gandy, RN

## 2019-07-21 NOTE — Telephone Encounter (Signed)
-----   Message from Emmaline Kluver sent at 07/15/2019  2:17 PM EDT ----- Contact: [phone] Patient left message needs someone to call about her sputum test results   Thanks  Joycelyn Schmid

## 2019-07-27 ENCOUNTER — Encounter: Payer: Self-pay | Admitting: Internal Medicine

## 2019-07-27 ENCOUNTER — Ambulatory Visit (INDEPENDENT_AMBULATORY_CARE_PROVIDER_SITE_OTHER): Payer: Medicare Other | Admitting: Internal Medicine

## 2019-07-27 ENCOUNTER — Other Ambulatory Visit: Payer: Self-pay

## 2019-07-27 DIAGNOSIS — Z8619 Personal history of other infectious and parasitic diseases: Secondary | ICD-10-CM | POA: Diagnosis not present

## 2019-07-27 NOTE — Progress Notes (Signed)
[facility]  Patient Active Problem List   Diagnosis Date Noted  . 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 Right upper lobe pulmonary nodule 03/11/2019    Priority: High  . Abdominal pain 11/25/2018    Priority: Medium  . Seasonal allergies 03/11/2019  . BENIGN NEOPLASM OF EYE PART UNSPECIFIED 08/08/2009  . GOITER, UNSPECIFIED 08/08/2009  . DYSLIPIDEMIA 08/08/2009  . ESOPHAGEAL REFLUX 08/08/2009  . 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 PERS HX TOBACCO USE PRESENTING HAZARDS HEALTH 08/08/2009    Patient's Medications  New Prescriptions   No medications on file  Previous Medications   ASCORBIC ACID (VITAMIN C WITH ROSE HIPS) 500 MG TABLET    Take 500 mg by mouth daily.   ASPIRIN 81 MG CHEWABLE TABLET    Chew by mouth daily.   ATENOLOL (TENORMIN) 25 MG TABLET    Take 25 mg by mouth daily.   BIOTIN 46568 MCG TABS    Take by mouth daily.   CALCIUM-MAGNESIUM-ZINC PO    Take by mouth daily.   CHOLECALCIFEROL (VITAMIN D3) 10 MCG (400 UNIT) CAPS    Take 2,000 Units by mouth daily.   COENZYME Q10 (COQ10) 200 MG CAPS    Take by mouth.   LORAZEPAM (ATIVAN) 1 MG TABLET    Take 1 mg by mouth at bedtime.   MULTIPLE VITAMIN (MULTIVITAMIN) TABLET    Take 1 tablet by mouth daily.   OMEGA-3 FATTY ACIDS (FISH OIL) 1000 MG CPDR    Take by mouth.   OXYCODONE (OXYCONTIN) 20 MG 12 HR TABLET    Take 20 mg by mouth every 12 (twelve) hours.   VITAMIN E (VITAMIN E) 400 UNIT CAPSULE    Take 400 Units by mouth daily.  Modified Medications   No medications on file  Discontinued Medications   No medications on file    Subjective: Ms. Salais is in for her routine follow-up visit.  She has not noted any change in the pattern or frequency of her cough.  She may go several days without bringing up any phlegm.  She will have intermittent coughing spells where she cannot stop but these are infrequent.  She  has not noted any increase in her mild dyspnea on exertion.  She says that she has been having hot flashes for the past 2 months.  They occur at night.  She does not think that she is having fever but has not taken her temperature.  She does not have any night sweats.  She continues to have intermittent mild discomfort under her right rib cage anteriorly.  Review of Systems: Review of Systems  Constitutional: Negative for chills, diaphoresis, fever and weight loss.  Respiratory: Positive for cough, sputum production and shortness of breath. Negative for hemoptysis.   Cardiovascular: Negative for chest pain.  Gastrointestinal: Positive for abdominal pain. Negative for constipation, diarrhea, nausea and vomiting.    Past Medical History:  Diagnosis Date  . Abdominal pain 11/25/2018  . Sciatica     Social History   Tobacco Use  . Smoking status: Never Smoker  . Smokeless tobacco: Never Used  Substance Use Topics  . Alcohol use: Never    Frequency: Never  . Drug use: Never    No family history on file.  Allergies  Allergen Reactions  . Ethambutol Other (See Comments)    High Fever--confirmed with sequential rechallenge  . Tetracycline  Hives    Objective: Vitals:   07/27/19 1524  BP: (!) 165/96  Pulse: 64  Temp: 98 F (36.7 C)   There is no height or weight on file to calculate BMI.  Physical Exam Constitutional:      Comments: She is pleasant and in no distress.  She is talkative.  Cardiovascular:     Rate and Rhythm: Normal rate and regular rhythm.     Heart sounds: No murmur.  Pulmonary:     Effort: Pulmonary effort is normal.     Breath sounds: Normal breath sounds.  Abdominal:     Palpations: Abdomen is soft.     Tenderness: There is no abdominal tenderness.  Psychiatric:        Mood and Affect: Mood normal.     Lab Results Sputum AFB smear 07/20/2019: 1+ positive   Problem List Items Addressed This Visit      High   History of Mycobacterium avium  complex infection    Given her history of Mycobacterium avium and a sputum culture in 2012 I suspect that it is quite likely that her AFB culture will grow it again.  However, that does not mean that it is causing her current problems or that it warrants treatment, especially since she had such great difficulty tolerating azithromycin, ethambutol and rifampin in 2012.  She is in agreement with continued observation off of antibiotics.  I did ask her to take her temperature each evening before bedtime to make certain that her hot flashes are not due to fever.  She will follow-up here in 3 months          Michel Bickers, MD [facility] [place] [phone] pager   [phone] cell 07/27/2019, 4:11 PM

## 2019-07-27 NOTE — Assessment & Plan Note (Signed)
Given her history of Mycobacterium avium and a sputum culture in 2012 I suspect that it is quite likely that her AFB culture will grow it again.  However, that does not mean that it is causing her current problems or that it warrants treatment, especially since she had such great difficulty tolerating azithromycin, ethambutol and rifampin in 2012.  She is in agreement with continued observation off of antibiotics.  I did ask her to take her temperature each evening before bedtime to make certain that her hot flashes are not due to fever.  She will follow-up here in 3 months

## 2019-07-28 ENCOUNTER — Other Ambulatory Visit: Payer: Self-pay

## 2019-08-02 DIAGNOSIS — G8929 Other chronic pain: Secondary | ICD-10-CM | POA: Diagnosis not present

## 2019-08-02 DIAGNOSIS — R202 Paresthesia of skin: Secondary | ICD-10-CM | POA: Diagnosis not present

## 2019-08-02 DIAGNOSIS — M545 Low back pain: Secondary | ICD-10-CM | POA: Diagnosis not present

## 2019-08-02 DIAGNOSIS — Z8619 Personal history of other infectious and parasitic diseases: Secondary | ICD-10-CM | POA: Diagnosis not present

## 2019-08-02 DIAGNOSIS — R42 Dizziness and giddiness: Secondary | ICD-10-CM | POA: Diagnosis not present

## 2019-08-02 DIAGNOSIS — Z87891 Personal history of nicotine dependence: Secondary | ICD-10-CM | POA: Diagnosis not present

## 2019-08-02 DIAGNOSIS — Z7982 Long term (current) use of aspirin: Secondary | ICD-10-CM | POA: Diagnosis not present

## 2019-08-02 DIAGNOSIS — G459 Transient cerebral ischemic attack, unspecified: Secondary | ICD-10-CM | POA: Diagnosis not present

## 2019-08-02 DIAGNOSIS — Z91040 Latex allergy status: Secondary | ICD-10-CM | POA: Diagnosis not present

## 2019-08-02 DIAGNOSIS — E785 Hyperlipidemia, unspecified: Secondary | ICD-10-CM | POA: Diagnosis not present

## 2019-08-02 DIAGNOSIS — F411 Generalized anxiety disorder: Secondary | ICD-10-CM | POA: Diagnosis not present

## 2019-08-02 DIAGNOSIS — Z881 Allergy status to other antibiotic agents status: Secondary | ICD-10-CM | POA: Diagnosis not present

## 2019-08-02 DIAGNOSIS — Z888 Allergy status to other drugs, medicaments and biological substances status: Secondary | ICD-10-CM | POA: Diagnosis not present

## 2019-08-02 DIAGNOSIS — R7989 Other specified abnormal findings of blood chemistry: Secondary | ICD-10-CM | POA: Diagnosis not present

## 2019-08-02 DIAGNOSIS — Z9049 Acquired absence of other specified parts of digestive tract: Secondary | ICD-10-CM | POA: Diagnosis not present

## 2019-08-02 DIAGNOSIS — Z79899 Other long term (current) drug therapy: Secondary | ICD-10-CM | POA: Diagnosis not present

## 2019-08-02 DIAGNOSIS — R918 Other nonspecific abnormal finding of lung field: Secondary | ICD-10-CM | POA: Diagnosis not present

## 2019-08-02 DIAGNOSIS — I214 Non-ST elevation (NSTEMI) myocardial infarction: Secondary | ICD-10-CM | POA: Diagnosis not present

## 2019-08-03 ENCOUNTER — Telehealth: Payer: Self-pay | Admitting: *Deleted

## 2019-08-03 DIAGNOSIS — I214 Non-ST elevation (NSTEMI) myocardial infarction: Secondary | ICD-10-CM | POA: Diagnosis not present

## 2019-08-03 DIAGNOSIS — E785 Hyperlipidemia, unspecified: Secondary | ICD-10-CM | POA: Diagnosis not present

## 2019-08-03 DIAGNOSIS — G8929 Other chronic pain: Secondary | ICD-10-CM | POA: Diagnosis not present

## 2019-08-03 DIAGNOSIS — G459 Transient cerebral ischemic attack, unspecified: Secondary | ICD-10-CM | POA: Diagnosis not present

## 2019-08-03 DIAGNOSIS — M545 Low back pain: Secondary | ICD-10-CM | POA: Diagnosis not present

## 2019-08-03 NOTE — Telephone Encounter (Signed)
[facility] called to report recent labs drawn 07/20/19  Rare (1 +) acid-fast bacilli seen using the fluorochrome method.  Advised will let the provider know.

## 2019-08-04 DIAGNOSIS — R002 Palpitations: Secondary | ICD-10-CM | POA: Diagnosis not present

## 2019-08-04 DIAGNOSIS — A319 Mycobacterial infection, unspecified: Secondary | ICD-10-CM | POA: Diagnosis not present

## 2019-08-04 DIAGNOSIS — I809 Phlebitis and thrombophlebitis of unspecified site: Secondary | ICD-10-CM | POA: Diagnosis not present

## 2019-08-04 DIAGNOSIS — I7090 Unspecified atherosclerosis: Secondary | ICD-10-CM | POA: Diagnosis not present

## 2019-08-04 DIAGNOSIS — R890 Abnormal level of enzymes in specimens from other organs, systems and tissues: Secondary | ICD-10-CM | POA: Diagnosis not present

## 2019-08-04 DIAGNOSIS — R079 Chest pain, unspecified: Secondary | ICD-10-CM | POA: Diagnosis not present

## 2019-08-05 DIAGNOSIS — R002 Palpitations: Secondary | ICD-10-CM | POA: Diagnosis not present

## 2019-08-05 DIAGNOSIS — R079 Chest pain, unspecified: Secondary | ICD-10-CM | POA: Diagnosis not present

## 2019-08-09 ENCOUNTER — Telehealth: Payer: Self-pay

## 2019-08-09 DIAGNOSIS — R0789 Other chest pain: Secondary | ICD-10-CM | POA: Diagnosis not present

## 2019-08-09 DIAGNOSIS — R002 Palpitations: Secondary | ICD-10-CM | POA: Diagnosis not present

## 2019-08-09 DIAGNOSIS — I350 Nonrheumatic aortic (valve) stenosis: Secondary | ICD-10-CM | POA: Diagnosis not present

## 2019-08-09 NOTE — Telephone Encounter (Signed)
MYCOBACTERIA, CULTURE, WITH FLUOROCHROME SMEAR   STATUS: PRELIMINARY P   SMEAR: Rare (1 +) acid-fast bacilli seen using the fluorochrome method.Abnormal  P   ISOLATE 1: Mycobacterium, avium-intracellulare group Abnormal  P   Comment: DNA probe result positive for Mycobacterium avium complex   Routing to provider to make aware. Copy also placed in providers box Eugenia Mcalpine, LPN

## 2019-08-10 DIAGNOSIS — I214 Non-ST elevation (NSTEMI) myocardial infarction: Secondary | ICD-10-CM | POA: Diagnosis not present

## 2019-09-03 LAB — MYCOBACTERIA,CULT W/FLUOROCHROME SMEAR
MICRO NUMBER:: 690128
SPECIMEN QUALITY:: ADEQUATE

## 2019-09-07 ENCOUNTER — Telehealth: Payer: Self-pay | Admitting: *Deleted

## 2019-09-07 DIAGNOSIS — Z23 Encounter for immunization: Secondary | ICD-10-CM | POA: Diagnosis not present

## 2019-09-07 NOTE — Telephone Encounter (Signed)
Yes, thanks

## 2019-09-07 NOTE — Telephone Encounter (Signed)
[facility] called to give final results of the Mycobacteria smear from 07/20/19. Advised will let provider know.

## 2019-09-22 DIAGNOSIS — I7090 Unspecified atherosclerosis: Secondary | ICD-10-CM | POA: Diagnosis not present

## 2019-09-22 DIAGNOSIS — A319 Mycobacterial infection, unspecified: Secondary | ICD-10-CM | POA: Diagnosis not present

## 2019-09-22 DIAGNOSIS — R890 Abnormal level of enzymes in specimens from other organs, systems and tissues: Secondary | ICD-10-CM | POA: Diagnosis not present

## 2019-09-22 DIAGNOSIS — I809 Phlebitis and thrombophlebitis of unspecified site: Secondary | ICD-10-CM | POA: Diagnosis not present

## 2019-09-22 DIAGNOSIS — I340 Nonrheumatic mitral (valve) insufficiency: Secondary | ICD-10-CM | POA: Diagnosis not present

## 2019-09-22 DIAGNOSIS — R079 Chest pain, unspecified: Secondary | ICD-10-CM | POA: Diagnosis not present

## 2019-09-22 DIAGNOSIS — R002 Palpitations: Secondary | ICD-10-CM | POA: Diagnosis not present

## 2019-09-30 DIAGNOSIS — J449 Chronic obstructive pulmonary disease, unspecified: Secondary | ICD-10-CM

## 2019-09-30 HISTORY — DX: Chronic obstructive pulmonary disease, unspecified: J44.9

## 2019-10-04 DIAGNOSIS — K219 Gastro-esophageal reflux disease without esophagitis: Secondary | ICD-10-CM | POA: Diagnosis not present

## 2019-10-04 DIAGNOSIS — Z1159 Encounter for screening for other viral diseases: Secondary | ICD-10-CM | POA: Diagnosis not present

## 2019-10-04 DIAGNOSIS — R0789 Other chest pain: Secondary | ICD-10-CM | POA: Diagnosis not present

## 2019-10-04 DIAGNOSIS — I2729 Other secondary pulmonary hypertension: Secondary | ICD-10-CM | POA: Diagnosis not present

## 2019-10-04 DIAGNOSIS — R05 Cough: Secondary | ICD-10-CM | POA: Diagnosis not present

## 2019-10-04 DIAGNOSIS — R002 Palpitations: Secondary | ICD-10-CM | POA: Diagnosis not present

## 2019-10-04 DIAGNOSIS — J449 Chronic obstructive pulmonary disease, unspecified: Secondary | ICD-10-CM | POA: Diagnosis not present

## 2019-10-04 DIAGNOSIS — I10 Essential (primary) hypertension: Secondary | ICD-10-CM | POA: Diagnosis not present

## 2019-10-04 DIAGNOSIS — J479 Bronchiectasis, uncomplicated: Secondary | ICD-10-CM | POA: Diagnosis not present

## 2019-10-04 DIAGNOSIS — A310 Pulmonary mycobacterial infection: Secondary | ICD-10-CM | POA: Diagnosis not present

## 2019-10-05 DIAGNOSIS — J479 Bronchiectasis, uncomplicated: Secondary | ICD-10-CM | POA: Diagnosis not present

## 2019-10-05 DIAGNOSIS — I2729 Other secondary pulmonary hypertension: Secondary | ICD-10-CM | POA: Diagnosis not present

## 2019-10-05 DIAGNOSIS — J449 Chronic obstructive pulmonary disease, unspecified: Secondary | ICD-10-CM | POA: Diagnosis not present

## 2019-10-05 DIAGNOSIS — R0789 Other chest pain: Secondary | ICD-10-CM | POA: Diagnosis not present

## 2019-10-05 DIAGNOSIS — A310 Pulmonary mycobacterial infection: Secondary | ICD-10-CM | POA: Diagnosis not present

## 2019-10-06 DIAGNOSIS — I2729 Other secondary pulmonary hypertension: Secondary | ICD-10-CM | POA: Diagnosis not present

## 2019-10-06 DIAGNOSIS — J479 Bronchiectasis, uncomplicated: Secondary | ICD-10-CM | POA: Diagnosis not present

## 2019-10-06 DIAGNOSIS — R05 Cough: Secondary | ICD-10-CM | POA: Diagnosis not present

## 2019-10-06 DIAGNOSIS — J449 Chronic obstructive pulmonary disease, unspecified: Secondary | ICD-10-CM | POA: Diagnosis not present

## 2019-10-08 DIAGNOSIS — J479 Bronchiectasis, uncomplicated: Secondary | ICD-10-CM | POA: Diagnosis not present

## 2019-10-08 DIAGNOSIS — R0789 Other chest pain: Secondary | ICD-10-CM | POA: Diagnosis not present

## 2019-10-08 DIAGNOSIS — J449 Chronic obstructive pulmonary disease, unspecified: Secondary | ICD-10-CM | POA: Diagnosis not present

## 2019-10-08 DIAGNOSIS — I2729 Other secondary pulmonary hypertension: Secondary | ICD-10-CM | POA: Diagnosis not present

## 2019-10-08 DIAGNOSIS — I350 Nonrheumatic aortic (valve) stenosis: Secondary | ICD-10-CM | POA: Diagnosis not present

## 2019-10-08 DIAGNOSIS — I10 Essential (primary) hypertension: Secondary | ICD-10-CM | POA: Diagnosis not present

## 2019-10-08 DIAGNOSIS — K219 Gastro-esophageal reflux disease without esophagitis: Secondary | ICD-10-CM | POA: Diagnosis not present

## 2019-10-08 DIAGNOSIS — R002 Palpitations: Secondary | ICD-10-CM | POA: Diagnosis not present

## 2019-10-12 DIAGNOSIS — I10 Essential (primary) hypertension: Secondary | ICD-10-CM | POA: Diagnosis not present

## 2019-10-12 DIAGNOSIS — E7849 Other hyperlipidemia: Secondary | ICD-10-CM | POA: Diagnosis not present

## 2019-10-12 DIAGNOSIS — M545 Low back pain: Secondary | ICD-10-CM | POA: Diagnosis not present

## 2019-10-27 ENCOUNTER — Ambulatory Visit: Payer: Medicare Other | Admitting: Internal Medicine

## 2019-11-08 DIAGNOSIS — M545 Low back pain: Secondary | ICD-10-CM | POA: Diagnosis not present

## 2019-11-08 DIAGNOSIS — I10 Essential (primary) hypertension: Secondary | ICD-10-CM | POA: Diagnosis not present

## 2019-11-08 DIAGNOSIS — E7849 Other hyperlipidemia: Secondary | ICD-10-CM | POA: Diagnosis not present

## 2019-12-15 ENCOUNTER — Other Ambulatory Visit: Payer: Self-pay | Admitting: Internal Medicine

## 2019-12-15 DIAGNOSIS — G8929 Other chronic pain: Secondary | ICD-10-CM

## 2019-12-20 ENCOUNTER — Other Ambulatory Visit: Payer: Self-pay | Admitting: Internal Medicine

## 2019-12-20 ENCOUNTER — Ambulatory Visit
Admission: RE | Admit: 2019-12-20 | Discharge: 2019-12-20 | Disposition: A | Discharge status: Home / Self Care | Payer: Medicare Other | Source: Ambulatory Visit | Attending: Internal Medicine | Admitting: Internal Medicine

## 2019-12-20 DIAGNOSIS — G8929 Other chronic pain: Secondary | ICD-10-CM

## 2019-12-20 DIAGNOSIS — M545 Low back pain, unspecified: Secondary | ICD-10-CM

## 2019-12-20 MED ORDER — METHYLPREDNISOLONE ACETATE 40 MG/ML INJ SUSP (RADIOLOG
120.0000 mg | Freq: Once | INTRAMUSCULAR | Status: AC
  Administered 2019-12-20: 120 mg via EPIDURAL

## 2019-12-20 MED ORDER — IOPAMIDOL (ISOVUE-M 200) INJECTION 41%
1.0000 mL | Freq: Once | INTRAMUSCULAR | Status: AC
  Administered 2019-12-20: 1 mL via EPIDURAL

## 2020-01-06 DIAGNOSIS — I10 Essential (primary) hypertension: Secondary | ICD-10-CM | POA: Diagnosis not present

## 2020-01-06 DIAGNOSIS — M545 Low back pain: Secondary | ICD-10-CM | POA: Diagnosis not present

## 2020-01-06 DIAGNOSIS — E7849 Other hyperlipidemia: Secondary | ICD-10-CM | POA: Diagnosis not present

## 2020-02-14 DIAGNOSIS — H25813 Combined forms of age-related cataract, bilateral: Secondary | ICD-10-CM | POA: Diagnosis not present

## 2020-02-22 DIAGNOSIS — R002 Palpitations: Secondary | ICD-10-CM | POA: Diagnosis not present

## 2020-02-22 DIAGNOSIS — I10 Essential (primary) hypertension: Secondary | ICD-10-CM | POA: Diagnosis not present

## 2020-02-22 DIAGNOSIS — E785 Hyperlipidemia, unspecified: Secondary | ICD-10-CM | POA: Diagnosis not present

## 2020-02-25 DIAGNOSIS — Z23 Encounter for immunization: Secondary | ICD-10-CM | POA: Diagnosis not present

## 2020-03-02 DIAGNOSIS — M545 Low back pain: Secondary | ICD-10-CM | POA: Diagnosis not present

## 2020-03-02 DIAGNOSIS — I10 Essential (primary) hypertension: Secondary | ICD-10-CM | POA: Diagnosis not present

## 2020-03-02 DIAGNOSIS — E7849 Other hyperlipidemia: Secondary | ICD-10-CM | POA: Diagnosis not present

## 2020-03-15 DIAGNOSIS — R002 Palpitations: Secondary | ICD-10-CM | POA: Diagnosis not present

## 2020-03-21 DIAGNOSIS — R002 Palpitations: Secondary | ICD-10-CM | POA: Diagnosis not present

## 2020-03-22 DIAGNOSIS — Z23 Encounter for immunization: Secondary | ICD-10-CM | POA: Diagnosis not present

## 2020-04-24 DIAGNOSIS — I10 Essential (primary) hypertension: Secondary | ICD-10-CM | POA: Diagnosis not present

## 2020-04-24 DIAGNOSIS — E785 Hyperlipidemia, unspecified: Secondary | ICD-10-CM | POA: Diagnosis not present

## 2020-04-24 DIAGNOSIS — R002 Palpitations: Secondary | ICD-10-CM | POA: Diagnosis not present

## 2020-05-17 DIAGNOSIS — D3131 Benign neoplasm of right choroid: Secondary | ICD-10-CM | POA: Diagnosis not present

## 2020-06-27 DIAGNOSIS — E7849 Other hyperlipidemia: Secondary | ICD-10-CM | POA: Diagnosis not present

## 2020-06-27 DIAGNOSIS — M545 Low back pain: Secondary | ICD-10-CM | POA: Diagnosis not present

## 2020-06-27 DIAGNOSIS — Z79899 Other long term (current) drug therapy: Secondary | ICD-10-CM | POA: Diagnosis not present

## 2020-06-27 DIAGNOSIS — R7303 Prediabetes: Secondary | ICD-10-CM | POA: Diagnosis not present

## 2020-06-27 DIAGNOSIS — I10 Essential (primary) hypertension: Secondary | ICD-10-CM | POA: Diagnosis not present

## 2020-06-27 DIAGNOSIS — Z0000 Encounter for general adult medical examination without abnormal findings: Secondary | ICD-10-CM | POA: Diagnosis not present

## 2020-07-21 DIAGNOSIS — I6782 Cerebral ischemia: Secondary | ICD-10-CM | POA: Diagnosis not present

## 2020-07-21 DIAGNOSIS — I6523 Occlusion and stenosis of bilateral carotid arteries: Secondary | ICD-10-CM | POA: Diagnosis not present

## 2020-07-21 DIAGNOSIS — M810 Age-related osteoporosis without current pathological fracture: Secondary | ICD-10-CM | POA: Diagnosis not present

## 2020-07-21 DIAGNOSIS — G459 Transient cerebral ischemic attack, unspecified: Secondary | ICD-10-CM | POA: Diagnosis not present

## 2020-08-08 DIAGNOSIS — L570 Actinic keratosis: Secondary | ICD-10-CM | POA: Diagnosis not present

## 2020-08-08 DIAGNOSIS — C44529 Squamous cell carcinoma of skin of other part of trunk: Secondary | ICD-10-CM | POA: Diagnosis not present

## 2020-08-08 DIAGNOSIS — D485 Neoplasm of uncertain behavior of skin: Secondary | ICD-10-CM | POA: Diagnosis not present

## 2020-08-11 DIAGNOSIS — R05 Cough: Secondary | ICD-10-CM | POA: Diagnosis not present

## 2020-08-11 DIAGNOSIS — R9389 Abnormal findings on diagnostic imaging of other specified body structures: Secondary | ICD-10-CM | POA: Diagnosis not present

## 2020-08-11 DIAGNOSIS — J449 Chronic obstructive pulmonary disease, unspecified: Secondary | ICD-10-CM | POA: Diagnosis not present

## 2020-08-11 DIAGNOSIS — J479 Bronchiectasis, uncomplicated: Secondary | ICD-10-CM | POA: Diagnosis not present

## 2020-08-11 DIAGNOSIS — R062 Wheezing: Secondary | ICD-10-CM | POA: Diagnosis not present

## 2020-08-11 DIAGNOSIS — Z8619 Personal history of other infectious and parasitic diseases: Secondary | ICD-10-CM | POA: Diagnosis not present

## 2020-08-17 DIAGNOSIS — C44529 Squamous cell carcinoma of skin of other part of trunk: Secondary | ICD-10-CM | POA: Diagnosis not present

## 2020-08-17 DIAGNOSIS — L570 Actinic keratosis: Secondary | ICD-10-CM | POA: Diagnosis not present

## 2020-10-04 DIAGNOSIS — E7849 Other hyperlipidemia: Secondary | ICD-10-CM | POA: Diagnosis not present

## 2020-10-04 DIAGNOSIS — M545 Low back pain, unspecified: Secondary | ICD-10-CM | POA: Diagnosis not present

## 2020-10-04 DIAGNOSIS — I10 Essential (primary) hypertension: Secondary | ICD-10-CM | POA: Diagnosis not present

## 2020-10-28 DIAGNOSIS — Z23 Encounter for immunization: Secondary | ICD-10-CM | POA: Diagnosis not present

## 2020-11-02 DIAGNOSIS — Z23 Encounter for immunization: Secondary | ICD-10-CM | POA: Diagnosis not present

## 2020-11-06 IMAGING — MG MM DIGITAL DIAGNOSTIC BILAT W/ TOMO W/ CAD
6 of 10 series · 6 of 30 positions shown · non-contrast
Comparison: Previous exam(s).

CLINICAL DATA: 77-year-old female with a palpable area of concern
in the left breast.

EXAM:
DIGITAL DIAGNOSTIC BILATERAL MAMMOGRAM WITH CAD AND TOMO
LEFT BREAST ULTRASOUND

[L MLO synth-2D]
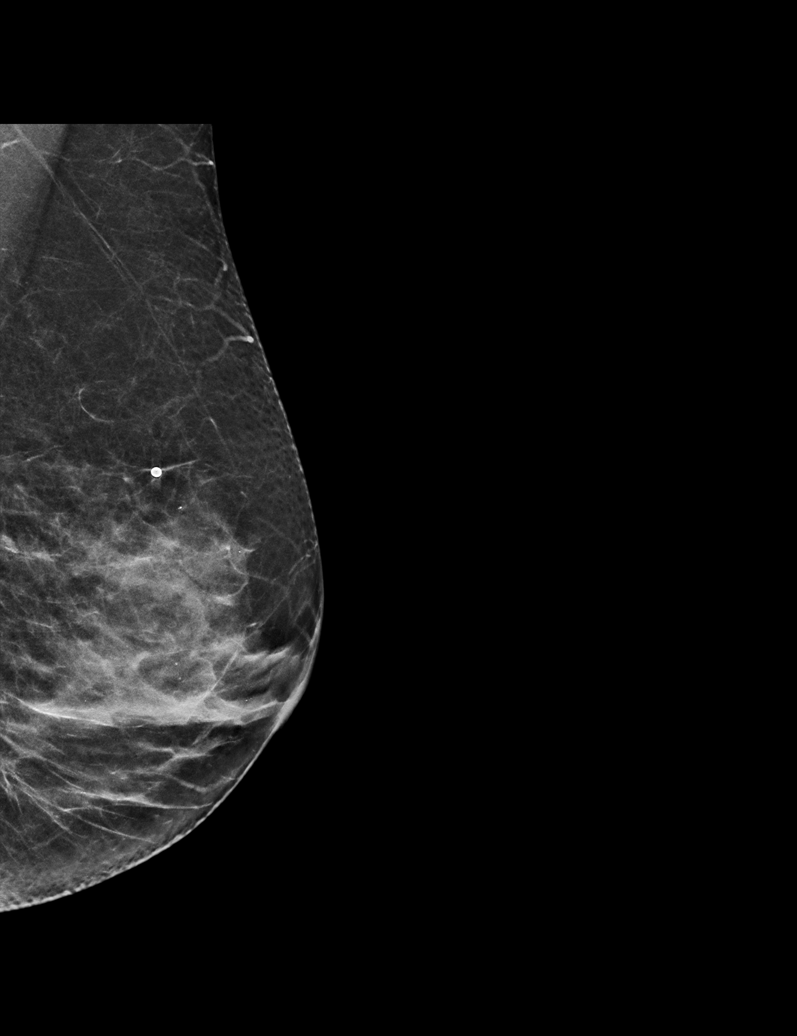

[R MLO synth-2D]
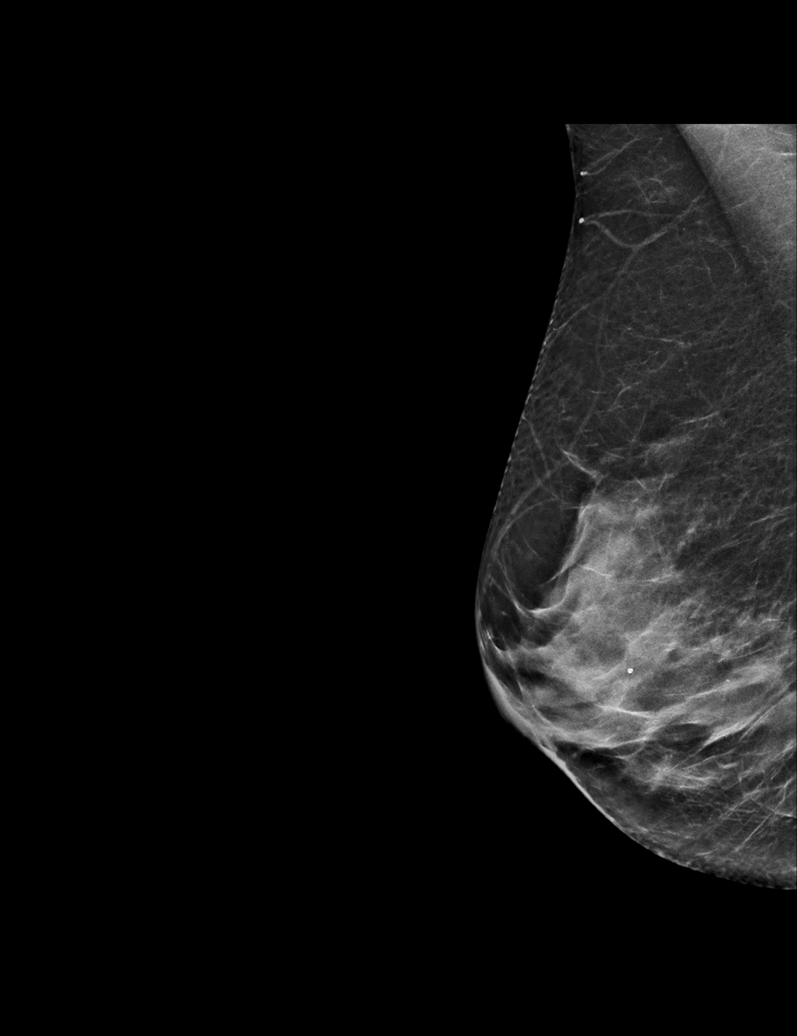

[R CC synth-2D]
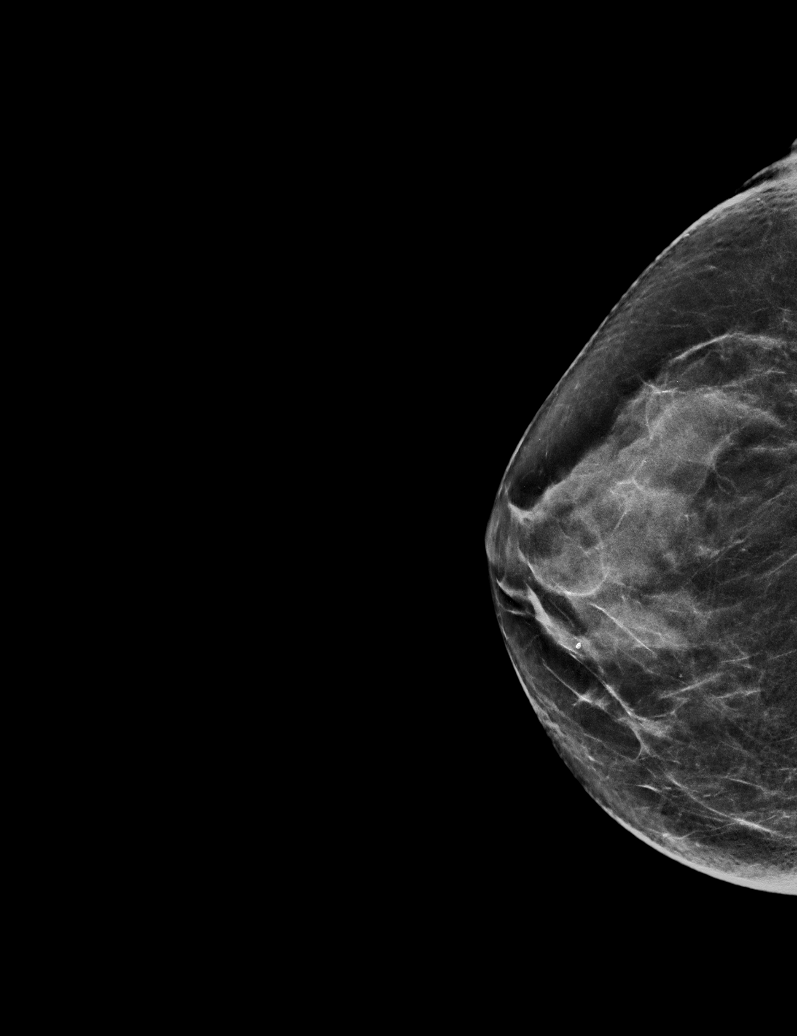

[L TAN synth-2D]
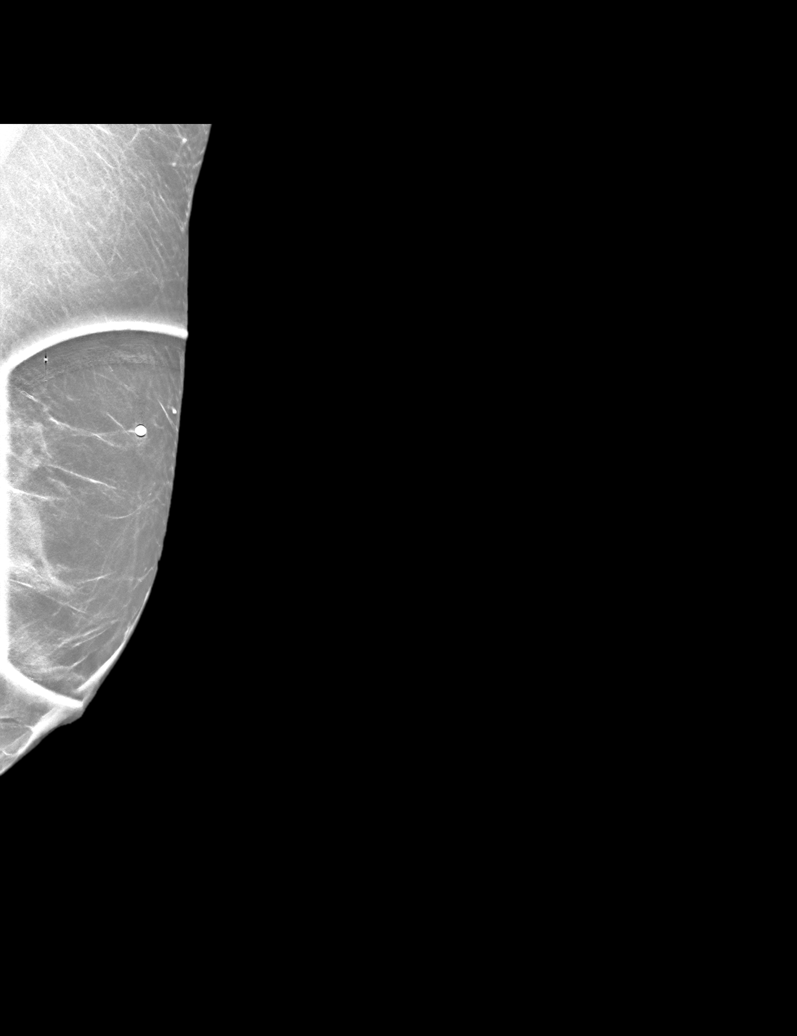

[L CC synth-2D]
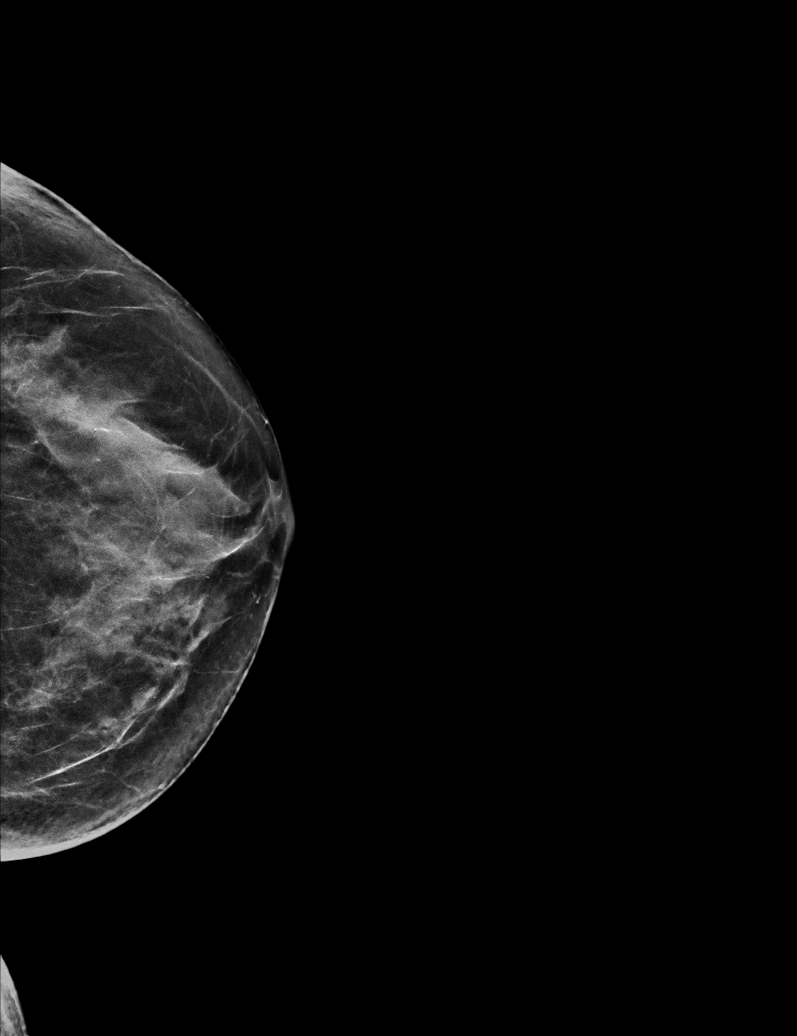

[R MLO tomo · tomo slice 29/58.0]
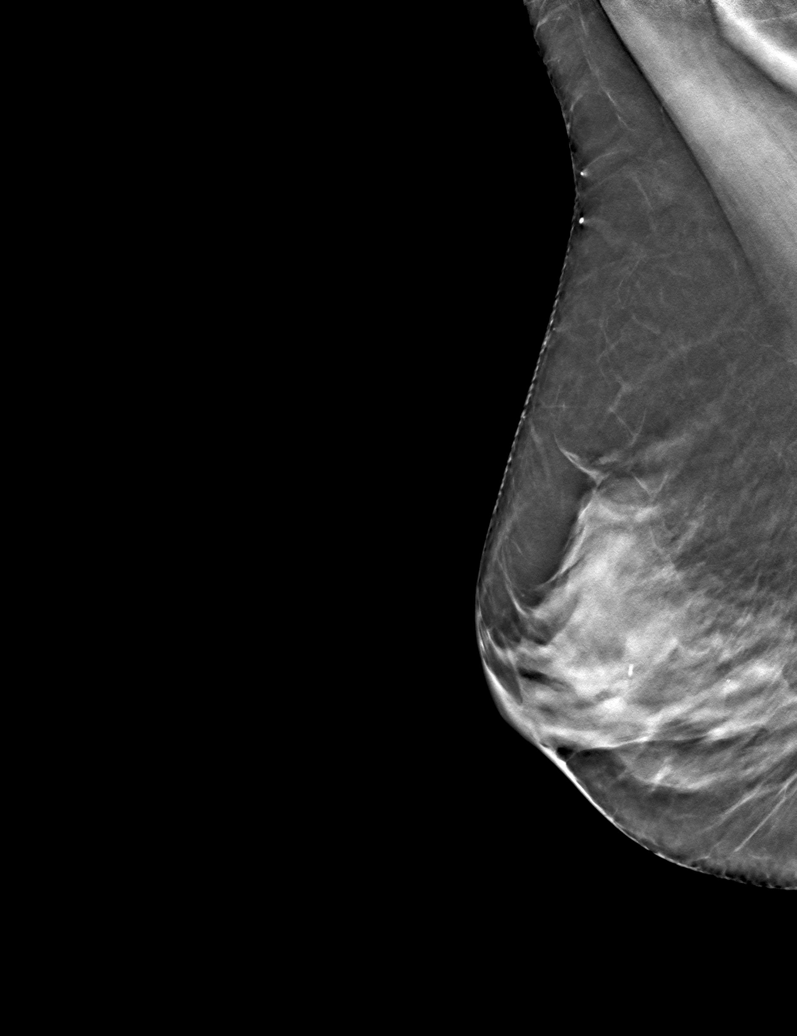

[6 of 30 positions shown; findings below may reference images not displayed]

ACR Breast Density Category c: The breast tissue is heterogeneously
dense, which may obscure small masses.
FINDINGS: No suspicious masses or calcifications are seen in either breast.
Spot compression tangential tomograms were performed over the area
of concern in the outer left breast with no definite abnormality
seen.

Mammographic images were processed with CAD.

Physical examination of the upper-outer left breast reveals
generalized soft thickening. No definite palpable mass.

Targeted ultrasound of the left breast was performed. No suspicious
masses or abnormality seen, only heterogeneous fibroglandular tissue
identified.
IMPRESSION: No abnormalities identified at site of palpable concern in the left
breast. There is no mammographic evidence of malignancy.

RECOMMENDATION:
1. Recommend further management of left breast palpable abnormality
be based on clinical assessment.

2.  Screening mammogram in one year.(Code:KH-X-T84)

I have discussed the findings and recommendations with the patient.
Results were also provided in writing at the conclusion of the
visit. If applicable, a reminder letter will be sent to the patient
regarding the next appointment.

BI-RADS CATEGORY  1: Negative.

## 2020-11-09 DIAGNOSIS — R9389 Abnormal findings on diagnostic imaging of other specified body structures: Secondary | ICD-10-CM | POA: Diagnosis not present

## 2020-11-09 DIAGNOSIS — J984 Other disorders of lung: Secondary | ICD-10-CM | POA: Diagnosis not present

## 2020-11-09 DIAGNOSIS — R062 Wheezing: Secondary | ICD-10-CM | POA: Diagnosis not present

## 2020-11-09 DIAGNOSIS — J479 Bronchiectasis, uncomplicated: Secondary | ICD-10-CM | POA: Diagnosis not present

## 2020-11-09 DIAGNOSIS — Z8619 Personal history of other infectious and parasitic diseases: Secondary | ICD-10-CM | POA: Diagnosis not present

## 2020-11-09 DIAGNOSIS — J449 Chronic obstructive pulmonary disease, unspecified: Secondary | ICD-10-CM | POA: Diagnosis not present

## 2020-11-09 DIAGNOSIS — R053 Chronic cough: Secondary | ICD-10-CM | POA: Diagnosis not present

## 2020-12-12 DIAGNOSIS — M810 Age-related osteoporosis without current pathological fracture: Secondary | ICD-10-CM | POA: Diagnosis not present

## 2020-12-12 DIAGNOSIS — G459 Transient cerebral ischemic attack, unspecified: Secondary | ICD-10-CM | POA: Diagnosis not present

## 2020-12-12 DIAGNOSIS — Z780 Asymptomatic menopausal state: Secondary | ICD-10-CM | POA: Diagnosis not present

## 2020-12-30 DIAGNOSIS — G4730 Sleep apnea, unspecified: Secondary | ICD-10-CM

## 2020-12-30 HISTORY — DX: Sleep apnea, unspecified: G47.30

## 2021-01-04 DIAGNOSIS — E7849 Other hyperlipidemia: Secondary | ICD-10-CM | POA: Diagnosis not present

## 2021-01-04 DIAGNOSIS — I10 Essential (primary) hypertension: Secondary | ICD-10-CM | POA: Diagnosis not present

## 2021-01-04 DIAGNOSIS — J4540 Moderate persistent asthma, uncomplicated: Secondary | ICD-10-CM | POA: Diagnosis not present

## 2021-01-04 DIAGNOSIS — M545 Low back pain, unspecified: Secondary | ICD-10-CM | POA: Diagnosis not present

## 2021-01-24 DIAGNOSIS — G4730 Sleep apnea, unspecified: Secondary | ICD-10-CM | POA: Diagnosis not present

## 2021-02-19 DIAGNOSIS — L570 Actinic keratosis: Secondary | ICD-10-CM | POA: Diagnosis not present

## 2021-03-26 DIAGNOSIS — J479 Bronchiectasis, uncomplicated: Secondary | ICD-10-CM | POA: Diagnosis not present

## 2021-03-26 DIAGNOSIS — J449 Chronic obstructive pulmonary disease, unspecified: Secondary | ICD-10-CM | POA: Diagnosis not present

## 2021-03-26 DIAGNOSIS — Z87891 Personal history of nicotine dependence: Secondary | ICD-10-CM | POA: Diagnosis not present

## 2021-03-26 DIAGNOSIS — A310 Pulmonary mycobacterial infection: Secondary | ICD-10-CM | POA: Diagnosis not present

## 2021-04-04 DIAGNOSIS — M545 Low back pain, unspecified: Secondary | ICD-10-CM | POA: Diagnosis not present

## 2021-04-04 DIAGNOSIS — I10 Essential (primary) hypertension: Secondary | ICD-10-CM | POA: Diagnosis not present

## 2021-04-04 DIAGNOSIS — Z23 Encounter for immunization: Secondary | ICD-10-CM | POA: Diagnosis not present

## 2021-04-04 DIAGNOSIS — J4540 Moderate persistent asthma, uncomplicated: Secondary | ICD-10-CM | POA: Diagnosis not present

## 2021-04-04 DIAGNOSIS — E7849 Other hyperlipidemia: Secondary | ICD-10-CM | POA: Diagnosis not present

## 2021-05-29 DIAGNOSIS — J449 Chronic obstructive pulmonary disease, unspecified: Secondary | ICD-10-CM | POA: Diagnosis not present

## 2021-05-29 DIAGNOSIS — A310 Pulmonary mycobacterial infection: Secondary | ICD-10-CM | POA: Diagnosis not present

## 2021-05-29 DIAGNOSIS — Z87891 Personal history of nicotine dependence: Secondary | ICD-10-CM | POA: Diagnosis not present

## 2021-05-29 DIAGNOSIS — J479 Bronchiectasis, uncomplicated: Secondary | ICD-10-CM | POA: Diagnosis not present

## 2021-06-09 DIAGNOSIS — I809 Phlebitis and thrombophlebitis of unspecified site: Secondary | ICD-10-CM | POA: Diagnosis not present

## 2021-06-09 DIAGNOSIS — I83891 Varicose veins of right lower extremities with other complications: Secondary | ICD-10-CM | POA: Diagnosis not present

## 2021-06-09 DIAGNOSIS — A319 Mycobacterial infection, unspecified: Secondary | ICD-10-CM | POA: Diagnosis not present

## 2021-06-09 DIAGNOSIS — R079 Chest pain, unspecified: Secondary | ICD-10-CM | POA: Diagnosis not present

## 2021-06-09 DIAGNOSIS — I340 Nonrheumatic mitral (valve) insufficiency: Secondary | ICD-10-CM | POA: Diagnosis not present

## 2021-06-09 DIAGNOSIS — R002 Palpitations: Secondary | ICD-10-CM | POA: Diagnosis not present

## 2021-06-21 DIAGNOSIS — L0390 Cellulitis, unspecified: Secondary | ICD-10-CM | POA: Diagnosis not present

## 2021-07-10 DIAGNOSIS — E7849 Other hyperlipidemia: Secondary | ICD-10-CM | POA: Diagnosis not present

## 2021-07-10 DIAGNOSIS — L570 Actinic keratosis: Secondary | ICD-10-CM | POA: Diagnosis not present

## 2021-07-10 DIAGNOSIS — I10 Essential (primary) hypertension: Secondary | ICD-10-CM | POA: Diagnosis not present

## 2021-07-11 ENCOUNTER — Other Ambulatory Visit: Payer: Self-pay | Admitting: Internal Medicine

## 2021-07-11 DIAGNOSIS — Z1231 Encounter for screening mammogram for malignant neoplasm of breast: Secondary | ICD-10-CM

## 2021-07-16 ENCOUNTER — Other Ambulatory Visit: Payer: Self-pay

## 2021-07-16 ENCOUNTER — Ambulatory Visit
Admission: RE | Admit: 2021-07-16 | Discharge: 2021-07-16 | Disposition: A | Discharge status: Home / Self Care | Payer: Medicare Other | Source: Ambulatory Visit | Attending: Internal Medicine | Admitting: Internal Medicine

## 2021-07-16 DIAGNOSIS — Z1231 Encounter for screening mammogram for malignant neoplasm of breast: Secondary | ICD-10-CM | POA: Diagnosis not present

## 2021-07-24 DIAGNOSIS — R0789 Other chest pain: Secondary | ICD-10-CM | POA: Diagnosis not present

## 2021-07-24 DIAGNOSIS — R002 Palpitations: Secondary | ICD-10-CM | POA: Diagnosis not present

## 2021-07-24 DIAGNOSIS — I348 Other nonrheumatic mitral valve disorders: Secondary | ICD-10-CM | POA: Diagnosis not present

## 2021-08-07 DIAGNOSIS — L570 Actinic keratosis: Secondary | ICD-10-CM | POA: Diagnosis not present

## 2021-08-07 DIAGNOSIS — Z85820 Personal history of malignant melanoma of skin: Secondary | ICD-10-CM | POA: Diagnosis not present

## 2021-08-07 DIAGNOSIS — Z85828 Personal history of other malignant neoplasm of skin: Secondary | ICD-10-CM | POA: Diagnosis not present

## 2021-08-07 DIAGNOSIS — D485 Neoplasm of uncertain behavior of skin: Secondary | ICD-10-CM | POA: Diagnosis not present

## 2021-08-17 ENCOUNTER — Other Ambulatory Visit: Payer: Self-pay | Admitting: Internal Medicine

## 2021-08-17 DIAGNOSIS — R1084 Generalized abdominal pain: Secondary | ICD-10-CM

## 2021-08-20 ENCOUNTER — Other Ambulatory Visit: Payer: Self-pay | Admitting: Internal Medicine

## 2021-08-20 DIAGNOSIS — N6489 Other specified disorders of breast: Secondary | ICD-10-CM

## 2021-08-23 DIAGNOSIS — L570 Actinic keratosis: Secondary | ICD-10-CM | POA: Diagnosis not present

## 2021-08-27 ENCOUNTER — Other Ambulatory Visit: Payer: Medicare Other

## 2021-09-05 ENCOUNTER — Other Ambulatory Visit: Payer: Self-pay

## 2021-09-05 ENCOUNTER — Ambulatory Visit
Admission: RE | Admit: 2021-09-05 | Discharge: 2021-09-05 | Disposition: A | Discharge status: Home / Self Care | Payer: Medicare Other | Source: Ambulatory Visit | Attending: Internal Medicine | Admitting: Internal Medicine

## 2021-09-05 DIAGNOSIS — R1084 Generalized abdominal pain: Secondary | ICD-10-CM

## 2021-09-05 DIAGNOSIS — K7689 Other specified diseases of liver: Secondary | ICD-10-CM | POA: Diagnosis not present

## 2021-09-05 DIAGNOSIS — N281 Cyst of kidney, acquired: Secondary | ICD-10-CM | POA: Diagnosis not present

## 2021-09-05 DIAGNOSIS — N6489 Other specified disorders of breast: Secondary | ICD-10-CM

## 2021-09-05 DIAGNOSIS — N644 Mastodynia: Secondary | ICD-10-CM | POA: Diagnosis not present

## 2021-09-05 MED ORDER — GADOBUTROL 1 MMOL/ML IV SOLN
6.0000 mL | Freq: Once | INTRAVENOUS | Status: AC | PRN
  Administered 2021-09-05: 6 mL via INTRAVENOUS

## 2021-09-10 DIAGNOSIS — I83891 Varicose veins of right lower extremities with other complications: Secondary | ICD-10-CM | POA: Diagnosis not present

## 2021-09-18 DIAGNOSIS — Z87891 Personal history of nicotine dependence: Secondary | ICD-10-CM | POA: Diagnosis not present

## 2021-09-18 DIAGNOSIS — A310 Pulmonary mycobacterial infection: Secondary | ICD-10-CM | POA: Diagnosis not present

## 2021-09-18 DIAGNOSIS — J479 Bronchiectasis, uncomplicated: Secondary | ICD-10-CM | POA: Diagnosis not present

## 2021-09-18 DIAGNOSIS — J449 Chronic obstructive pulmonary disease, unspecified: Secondary | ICD-10-CM | POA: Diagnosis not present

## 2021-10-01 DIAGNOSIS — R079 Chest pain, unspecified: Secondary | ICD-10-CM | POA: Diagnosis not present

## 2021-10-01 DIAGNOSIS — I340 Nonrheumatic mitral (valve) insufficiency: Secondary | ICD-10-CM | POA: Diagnosis not present

## 2021-10-01 DIAGNOSIS — A319 Mycobacterial infection, unspecified: Secondary | ICD-10-CM | POA: Diagnosis not present

## 2021-10-01 DIAGNOSIS — I809 Phlebitis and thrombophlebitis of unspecified site: Secondary | ICD-10-CM | POA: Diagnosis not present

## 2021-10-01 DIAGNOSIS — I83891 Varicose veins of right lower extremities with other complications: Secondary | ICD-10-CM | POA: Diagnosis not present

## 2021-10-01 DIAGNOSIS — R002 Palpitations: Secondary | ICD-10-CM | POA: Diagnosis not present

## 2021-10-09 DIAGNOSIS — E7849 Other hyperlipidemia: Secondary | ICD-10-CM | POA: Diagnosis not present

## 2021-10-09 DIAGNOSIS — I10 Essential (primary) hypertension: Secondary | ICD-10-CM | POA: Diagnosis not present

## 2021-10-09 DIAGNOSIS — I700 Atherosclerosis of aorta: Secondary | ICD-10-CM | POA: Diagnosis not present

## 2021-10-09 DIAGNOSIS — K219 Gastro-esophageal reflux disease without esophagitis: Secondary | ICD-10-CM | POA: Diagnosis not present

## 2021-10-09 DIAGNOSIS — J449 Chronic obstructive pulmonary disease, unspecified: Secondary | ICD-10-CM | POA: Diagnosis not present

## 2021-10-11 DIAGNOSIS — H25813 Combined forms of age-related cataract, bilateral: Secondary | ICD-10-CM | POA: Diagnosis not present

## 2021-10-16 DIAGNOSIS — Z23 Encounter for immunization: Secondary | ICD-10-CM | POA: Diagnosis not present

## 2021-10-17 DIAGNOSIS — Z23 Encounter for immunization: Secondary | ICD-10-CM | POA: Diagnosis not present

## 2021-10-24 DIAGNOSIS — J479 Bronchiectasis, uncomplicated: Secondary | ICD-10-CM | POA: Diagnosis not present

## 2021-10-24 DIAGNOSIS — R911 Solitary pulmonary nodule: Secondary | ICD-10-CM | POA: Diagnosis not present

## 2021-10-24 DIAGNOSIS — I700 Atherosclerosis of aorta: Secondary | ICD-10-CM | POA: Diagnosis not present

## 2021-10-24 DIAGNOSIS — R918 Other nonspecific abnormal finding of lung field: Secondary | ICD-10-CM | POA: Diagnosis not present

## 2021-11-06 ENCOUNTER — Ambulatory Visit (INDEPENDENT_AMBULATORY_CARE_PROVIDER_SITE_OTHER): Payer: Medicare Other | Admitting: Internal Medicine

## 2021-11-06 ENCOUNTER — Other Ambulatory Visit: Payer: Self-pay

## 2021-11-06 VITALS — BP 151/74 | HR 60 | Temp 97.9°F | Wt 134.0 lb

## 2021-11-06 DIAGNOSIS — J479 Bronchiectasis, uncomplicated: Secondary | ICD-10-CM | POA: Diagnosis not present

## 2021-11-06 DIAGNOSIS — Z2239 Carrier of other specified bacterial diseases: Secondary | ICD-10-CM

## 2021-11-06 MED ORDER — GUAIFENESIN-CODEINE 100-10 MG/5ML PO SOLN: 5.0000 mL | Freq: Four times a day (QID) | ORAL | 0 refills | Status: DC | PRN

## 2021-11-06 NOTE — Addendum Note (Signed)
Addended byPrudencio Pair T on: 11/06/2021 02:45 PM   Modules accepted: Orders

## 2021-11-06 NOTE — Patient Instructions (Signed)
Go about your normal daily activities  Your chest ct remains essentially the same or slightly better compare to 2 years ago. Your symptoms are also lacking to suggest the need to treat.  For your current recovery of the respiratory illness, I can prescribe you some codeine for cough  I have referred you to our pulmonologists to continue follow as well   Will see you in 3 months. Periodicically will need to repeat chest ct, but will decide base on history of subsequent visit

## 2021-11-06 NOTE — Progress Notes (Signed)
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   MRN: [id number]  Chief Complaint  Patient presents with   Follow-up    HPI:  Jeimy Brunetti is a 80 y.o. 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 She last saw him in 2020  Has a bronchiectasis exacerbation 1 week ago where she coughed up more green sputum tinge with blood. This was associated with fever. She was treated for copd/bronchiectasis given 7 day of doxycycline. Her symptoms are better now almost back to baseline, although still tired now but again improving. The doxy caused her tongue to burn and develop sore, which improved but still there  When she last saw dr Megan Salon she didn't have the clinical syndromes (besides microbiologic/imaging suggestion) of MAC lung infection. She had opted to be observed off abx. She did try mac treatment in 2012 but had rather significant side effect to the abx  She has intermittent trace hemoptysis that hasn't gotten worse  She had been followed by her lung doc as well who just retired. She'll be looking for a new lung doctor. She hasn't had any spirometry for 2 years  Today 11/06/2021, she again just got over an acute respiratory illness, so still feeling tired. Before she got the illness she has been  feeling the same as 2 years. She does all her house chores including vaccuming (everything), and walking a couple mile flat ground without issue.   She lives with her husband  She has a ct scan of her chest on 10/24/2021 from [facility] that actually showed slight improvement from 2020  No weight loss No nightsweat  She has a vibratory vest now that help with her pulmonary toilet and that works very good   Allergies  Allergen Reactions   Ethambutol Other (See Comments)    High Fever--confirmed with sequential rechallenge   Tetracycline Hives   Doxycycline Other (See Comments)    Mouth soreness       Outpatient Medications Prior to Visit  Medication Sig Dispense Refill   Ascorbic Acid (VITAMIN C WITH ROSE HIPS) 500 MG tablet Take 500 mg by mouth daily.     aspirin 81 MG chewable tablet Chew by mouth daily.     atorvastatin (LIPITOR) 10 MG tablet Take 10 mg by mouth daily.     Biotin 10000 MCG TABS Take by mouth daily.     CALCIUM-MAGNESIUM-ZINC PO Take by mouth daily.     Cholecalciferol (VITAMIN D3) 10 MCG (400 UNIT) CAPS Take 2,000 Units by mouth daily.     Coenzyme Q10 (COQ10) 200 MG CAPS Take by mouth.     metoprolol succinate (TOPROL-XL) 25 MG 24 hr tablet Take 25 mg by mouth daily.     Multiple Vitamin (MULTIVITAMIN) tablet Take 1 tablet by mouth daily.  nitroGLYCERIN (NITROSTAT) 0.4 MG SL tablet nitroglycerin 0.4 mg sublingual tablet  prn     Omega-3 Fatty Acids (FISH OIL) 1000 MG CPDR Take by mouth.     vitamin E 180 MG (400 UNITS) capsule Take 400 Units by mouth daily.     LORazepam (ATIVAN) 1 MG tablet Take 1 mg by mouth at bedtime. (Patient not taking: Reported on 11/06/2021)     oxyCODONE (OXYCONTIN) 20 mg 12 hr tablet Take 20 mg by mouth every 12 (twelve) hours. (Patient not taking: Reported on 11/06/2021)     atenolol (TENORMIN) 25 MG tablet Take 25 mg by mouth daily. (Patient not taking: Reported on 11/06/2021)     No facility-administered medications prior  to visit.     Social History   Socioeconomic History   Marital status: Married    Spouse name: Not on file   Number of children: Not on file   Years of education: Not on file   Highest education level: Not on file  Occupational History   Not on file  Tobacco Use   Smoking status: Never   Smokeless tobacco: Never  Substance and Sexual Activity   Alcohol use: Never   Drug use: Never   Sexual activity: Not on file  Other Topics Concern   Not on file  Social History Narrative   Not on file   Social Determinants of Health   Financial Resource Strain: Not on file  Food Insecurity: Not on file  Transportation Needs: Not on file  Physical Activity: Not on file  Stress: Not on file  Social Connections: Not on file  Intimate Partner Violence: Not on file      Review of Systems    All other ros negative  Objective:    BP (!) 151/74   Pulse 60   Temp 97.9 F (36.6 C) (Oral)   Wt 134 lb (60.8 kg)   BMI 19.79 kg/m  Nursing note and vital signs reviewed.  Physical Exam     General/constitutional: no distress, pleasant HEENT: Normocephalic, PER, Conj Clear, EOMI, Oropharynx clear Neck supple CV: rrr no mrg Lungs: clear to auscultation, normal respiratory effort Abd: Soft, Nontender Ext: no edema Skin: No Rash Neuro: nonfocal MSK: no peripheral joint swelling/tenderness/warmth; back spines nontender     Labs:  Micro:  Serology:  Imaging: 10/24/2021 [facility] -similar appearance of bronchiectasis with tree-in-bud opacity right middle lobe and right lower lobe, with decrease in tree-in-bud nodularity previously in the posterior right lower lobe.  -stable left adrenal adeoma  Assessment & Plan:   Problem List Items Addressed This Visit   None Visit Diagnoses     Bronchiectasis without complication ([place])    -  Primary   Relevant Orders   Ambulatory referral to Pulmonology   Mycobacterium avium complex colonization       Relevant  Orders   Ambulatory referral to Pulmonology         No orders of the defined types were placed in this encounter.    Doing well/stable from [place] standpoint Recent respiratory viral infection improving. Likely could benefit from antitussive to help sleeping.   -codeine rx -f/u 3 months with me -will consider repeating sputum culture in 3 months -referal to pulmonology for ongoing care of bronchiectasis/copd    Follow-up: Return in about 3 months (around 02/06/2022).      Jabier Mutton, [facility] [place] [phone]  pager   [phone] cell 11/06/2021,  2:19 PM

## 2021-12-26 ENCOUNTER — Ambulatory Visit (INDEPENDENT_AMBULATORY_CARE_PROVIDER_SITE_OTHER): Payer: Medicare Other | Admitting: Internal Medicine

## 2021-12-26 ENCOUNTER — Other Ambulatory Visit: Payer: Self-pay

## 2021-12-26 ENCOUNTER — Encounter: Payer: Self-pay | Admitting: Internal Medicine

## 2021-12-26 DIAGNOSIS — R918 Other nonspecific abnormal finding of lung field: Secondary | ICD-10-CM

## 2021-12-26 DIAGNOSIS — J479 Bronchiectasis, uncomplicated: Secondary | ICD-10-CM

## 2021-12-26 NOTE — Progress Notes (Signed)
Suzanne Davis, female    DOB: 1941/06/21    MRN: [id number]   Brief patient profile:  10 yowf quit smoking in 1981 referred to pulmonary clinic in [place]  12/26/2021 by Dr Gale Journey  for abnormal CT ? MAC with dx of bronchiectasis around 2012 by Dr [facility] eval rx saline / VEST /  [facility] 2020 Initial [facility] evaluation October 04, 2019. Please see that note for details. In summary, 80 y.o. female, past smoker (approximately a pack per day for 15 years quit 1981) who presented for evaluation of multiple complaints including: chest pain, mucus production, palpitations, sinus congestion, ear fluid, throat clearing, and intermittent heartburn. In 2012, she was diagnosed with mycobacterium avium intracellulare (MAI) treated with azithromycin, ethambutol and rifampin but developed subsequent anorexia, worsening insomnia, and other constitutional symptoms. She was then restarted on treatment with EMB as a single agent after which she had significant flushing and high fevers; therefore, the effort to treat the MAI was abandoned.   She has been followed with serial chest CTs demonstrate bronchiectasis, pulmonary nodules, tree-in-bud infiltrates, left diaphragmatic hernia, as well as pleural/pericardial calcifications among those findings previous documented. In addition she had a PET-CT in 2011 that did not indicate malignancy. I only have the interpretative reports; I do not have the images to review. No PFTs are available. She is not currently on any treatment other than hypertonic saline which she uses every evening. No anti-microbials, bronchodilators, or oxygen. She uses Tums for the intermittent heartburn.  Of note she had influenza vaccination September 2020. She has had both pneumococcal vaccinations.   Diagnostics were ordered.  [facility] Evaluation: Blood work was unremarkable including CBC, chemistries, immunoglobulins, connective tissue disease screening, BNP, alpha-1 antitrypsin  level, and QuantiFERON TB. SARS-Co-V2 was negative.  6MWT: 406 m on RA; Nadir SpO2 95% SHAPE VE/VCO2 28 PFTS October 06, 2019: BMI 20. TLC 89% predicted. FEV1 1.44 L, 63%; FVC 2.34 L, 78%; FEV1/FVC Ratio 61%; no significant change of bronchodilator; DLCO 13.6, 64%; SpO2 97%, decreasing to 93% with exercise. Overnight oximetry: Room air study. Average SpO2 90.8%. Oxygen desaturation index 13.8 events/hour. Chest CT: 1. Diffuse bronchiectasis with multiple areas of airway centered/tree-in-bud nodules predominantly in the right lower and middle lobe consistent with reported history of atypical mycobacterial infection. 2. Irregular nodule containing curvilinear high attenuation material size calcification in the right apex. Comparison to prior outside imaging when available would be helpful to confirm stability.  ECG: Normal sinus rhythm. Left axis deviation. Echocardiography: Normal study except mildly sclerotic aortic valve with associated mild aortic valvular regurgitation. Specifically, normal left and right ventricle and no evidence of pulmonary hypertension.  Impression and Recommendations: 1. Mycobacterium Avium Intracellulare. Prior intolerance to antimycobacterial agents. Moderate obstructive disease by PFTs. Diffuse mild cylindrical bronchiectasis with more focal involvement in the right middle lobe and lingula. Multiple clusters of tree-in-bud infiltrates in the right middle and lower lobes. Prior chest CT--prior imaging studies are not available for comparison. Only current treatment is saline nebulization. We discussed adding a bronchodilator and Acapella vibratory device to promote pulmonary clearance. 2. COPD. Prior history of smoking. Moderate but no evidence of significant hypoxemia or cor pulmonale. 3. Abnormal Overnight Oximetry. Suggestive of moderate sleep disorder breathing. Recommend sleep medicine evaluation. 4. Solitary Pulmonary Nodule. There is an irregular nodule in the right  apex, approximately 1.2 cm. Associated calcification and adjacent scarring suggest that the nodule is benign; however, comparison to outside imaging would be useful to confirm. She will mail  CD ROM with past imaging studies to [facility] for my review. 5. History of elevated right heart pressures by echocardiography. Current studies are normal. She was reassured. No further evaluation is recommended.   History of Present Illness  12/26/2021  Pulmonary/ 1st office eval/ Suzanne Davis / [place] Office  Chief Complaint  Patient presents with   Consult    Referred by Dr. Gale Journey for abnormal CT scan    Dyspnea:  Not limited by breathing from desired activities   Cough: variably discolored/ last bloody 2 weeks prior to ov Sleep: fine 30 degrees  SABA use: none   No obvious day to day or daytime variability or assoc   mucus plugs or hemoptysis or cp or chest tightness, subjective wheeze or overt sinus or hb symptoms.   sleeping without nocturnal  or early am exacerbation  of respiratory  c/o's or need for noct saba. Also denies any obvious fluctuation of symptoms with weather or environmental changes or other aggravating or alleviating factors except as outlined above   No unusual exposure hx or h/o childhood pna/ asthma or knowledge of premature birth.  Current Allergies, Complete Past Medical History, Past Surgical History, Family History, and Social History were reviewed in [employer] record.  ROS  The following are not active complaints unless bolded Hoarseness, sore throat, dysphagia, dental problems, itching, sneezing,  nasal congestion or discharge of excess mucus or purulent secretions, ear ache,   fever, chills, sweats, unintended wt loss or wt gain, classically pleuritic or exertional cp,  orthopnea pnd or arm/hand swelling  or leg swelling, presyncope, palpitations, abdominal pain, anorexia, nausea, vomiting, diarrhea  or change in bowel habits or change in bladder habits,  change in stools or change in urine, dysuria, hematuria,  rash, arthralgias, visual complaints, headache, numbness, weakness or ataxia or problems with walking or coordination,  change in mood or  memory.             Past Medical History:  Diagnosis Date   Abdominal pain 11/25/2018   Sciatica     Outpatient Medications Prior to Visit  Medication Sig Dispense Refill   Ascorbic Acid (VITAMIN C WITH ROSE HIPS) 500 MG tablet Take 500 mg by mouth daily.     aspirin 81 MG chewable tablet Chew by mouth daily.     atorvastatin (LIPITOR) 10 MG tablet Take 10 mg by mouth daily.     Biotin 10000 MCG TABS Take by mouth daily.     CALCIUM-MAGNESIUM-ZINC PO Take by mouth daily.     Cholecalciferol (VITAMIN D3) 10 MCG (400 UNIT) CAPS Take 2,000 Units by mouth daily.     Coenzyme Q10 (COQ10) 200 MG CAPS Take by mouth.     guaiFENesin-codeine 100-10 MG/5ML syrup Take 5 mLs by mouth every 6 (six) hours as needed for cough. 120 mL 0   LORazepam (ATIVAN) 1 MG tablet Take 1 mg by mouth at bedtime.     metoprolol succinate (TOPROL-XL) 25 MG 24 hr tablet Take 25 mg by mouth daily.     Multiple Vitamin (MULTIVITAMIN) tablet Take 1 tablet by mouth daily.     nitroGLYCERIN (NITROSTAT) 0.4 MG SL tablet nitroglycerin 0.4 mg sublingual tablet  prn     Omega-3 Fatty Acids (FISH OIL) 1000 MG CPDR Take by mouth.     oxyCODONE (OXYCONTIN) 20 mg 12 hr tablet Take 20 mg by mouth every 12 (twelve) hours.     vitamin E 180 MG (400 UNITS) capsule Take 400 Units  by mouth daily.     No facility-administered medications prior to visit.     Objective:     BP 132/76 (BP Location: Left Arm, Patient Position: Sitting)    Pulse 76    Temp 98.4 F (36.9 C) (Temporal)    Ht _0  (1.727 m)    Wt 135 lb 1.3 oz (61.3 kg)    SpO2 98% Comment: ra   BMI 20.54 kg/m   SpO2: 98 % (ra)  Anxious elderly amb wf nad  HEENT : pt wearing mask not removed for exam due to covid - 19 concerns.   NECK :  without JVD/Nodes/TM/ nl  carotid upstrokes bilaterally   LUNGS: no acc muscle use,  Min barrel  contour chest wall with bilateral  R>L min insp squeaks/pops s audible wheeze and  without cough on insp or exp maneuvers and min  Hyperresonant  to  percussion bilaterally     CV:  RRR  no s3 or murmur or increase in P2, and no edema   ABD:  soft and nontender with pos end  insp Hoover's  in the supine position. No bruits or organomegaly appreciated, bowel sounds nl  MS:   Nl gait/  ext warm without deformities, calf tenderness, cyanosis or clubbing No obvious joint restrictions   SKIN: warm and dry without lesions    NEURO:  alert, approp, nl sensorium with  no motor or cerebellar deficits apparent.        I personally reviewed images and agree with radiology impression as follows:   Chest CT w/o contrast 10/24/21 1. Similar appearance of bronchiectasis with tree-in-bud opacity in  the right middle lobe and right lower lobe with decrease in  tree-in-bud nodularity seen previously in the posterior right lower  lobe. Imaging features are compatible with sequelae of atypical  infection.   CXR PA and Lateral:   12/26/2021 :    I personally reviewed images and impression is as follows:     Did not go for cxr as rec      Assessment   Obstructive bronchiectasis ([place]) Onset of symptoms around 2010/2012  With MAI by Koleen Nimrod BAL  But intol of MAI Rx - w/u in [facility] >  Neg alfpha one/ neg Quant Tb -   PFTS October 06, 2019: BMI 20. TLC 89% predicted. FEV1 1.44 L, 63%; FVC 2.34 L, 78%; FEV1/FVC Ratio 61%; no significant change of bronchodilator; DLCO 13.6, 64%; - 12/26/2021 rec mucinex dm/ flutter/ VEST and am sputum with next flare, consider referral to [facility] / Olena Heckle   Discussed with pt  Bronchiectasis =   you have scarring of your bronchial tubes which means that they don't function perfectly normally and mucus tends to pool in certain areas of your lung which can cause pneumonia and  further scarring of your lung and bronchial tubes  Whenever you develop cough congestion take mucinex or mucinex dm > these will help keep the mucus loose and flowing and use flutter/ vest as much as you can   >>> return with flutter for training / baseline cxr if we are going to follow you here > declined.  Consider referral to [facility] as above   Multiple pulmonary nodules determined by computed tomography of lung C/w low grade MAI infection NB  This is an extremely common benign condition in the elderly and does not warrant aggressive eval/ rx at this point unless there is a clinical correlation suggesting unaddressed pulmonary infection (  purulent sputum, night sweats, unintended wt loss, doe) or evolution of  obvious changes on plain cxr (as opposed to serial CT, which is way over sensitive to make clinical decisions re intervention and treatment in the elderly, who tend to tolerate both dx and treatment poorly and as has been clearly the case here.  rec  Sputum for next flare / f/u at q 3 m if wants to be followed here which remains in doubt.    Each maintenance medication was reviewed in detail including emphasizing most importantly the difference between maintenance and prns and under what circumstances the prns are to be triggered using an action plan format where appropriate.  Total time for H and P, chart review, counseling, reviewing flutter/ VEST device(s) and generating customized AVS unique to this office visit / same day charting  > 45 min        Christinia Gully, MD 12/27/2021

## 2021-12-26 NOTE — Patient Instructions (Addendum)
Cough / congestion > mucinex dm 1200 mg every 12 hours as needed and use the flutter and the VEST as much as you can   We will be referring you to an ENT doctor in [place] and also I would recommend you see Dr Quillian Quince at [place] pulmonary department but will hold of on that for now   GERD (REFLUX)  is an extremely common cause of respiratory symptoms just like yours , many times with no obvious heartburn at all.    It can be treated with medication, but also with lifestyle changes including elevation of the head of your bed (ideally with 6 -8inch blocks under the headboard of your bed),  Smoking cessation, avoidance of late meals, excessive alcohol, and avoid fatty foods, chocolate, peppermint, colas, red wine, and acidic juices such as orange juice.  NO MINT OR MENTHOL PRODUCTS SO NO COUGH DROPS  USE SUGARLESS CANDY INSTEAD (Jolley ranchers or Stover's or Life Savers) or even ice chips will also do - the key is to swallow to prevent all throat clearing. NO OIL BASED VITAMINS - use powdered substitutes.  Avoid fish oil when coughing.   We will give you a cup to save the next time you get a really nasty dark green flare of mucus  (culture for afb and routine stain and culture will be drawn)   Please schedule a follow up visit in 3 months but call sooner if needed with all medications/flutter/solutions

## 2021-12-27 ENCOUNTER — Encounter: Payer: Self-pay | Admitting: Internal Medicine

## 2021-12-27 NOTE — Assessment & Plan Note (Addendum)
C/w low grade MAI infection NB  This is an extremely common benign condition in the elderly and does not warrant aggressive eval/ rx at this point unless there is a clinical correlation suggesting unaddressed pulmonary infection (purulent sputum, night sweats, unintended wt loss, doe) or evolution of  obvious changes on plain cxr (as opposed to serial CT, which is way over sensitive to make clinical decisions re intervention and treatment in the elderly, who tend to tolerate both dx and treatment poorly and as has been clearly the case here.  rec  Sputum for next flare / f/u at q 3 m if wants to be followed here which remains in doubt.

## 2021-12-27 NOTE — Assessment & Plan Note (Signed)
Onset of symptoms around 2010/2012  With MAI by Koleen Nimrod BAL  But intol of MAI Rx - w/u in [facility] >  Neg alfpha one/ neg Quant Tb -   PFTS October 06, 2019: BMI 20. TLC 89% predicted. FEV1 1.44 L, 63%; FVC 2.34 L, 78%; FEV1/FVC Ratio 61%; no significant change of bronchodilator; DLCO 13.6, 64%; - 12/26/2021 rec mucinex dm/ flutter/ VEST and am sputum with next flare, consider referral to [facility] / Olena Heckle   Discussed with pt  Bronchiectasis =   you have scarring of your bronchial tubes which means that they don't function perfectly normally and mucus tends to pool in certain areas of your lung which can cause pneumonia and further scarring of your lung and bronchial tubes  Whenever you develop cough congestion take mucinex or mucinex dm > these will help keep the mucus loose and flowing and use flutter/ vest as much as you can   >>> return with flutter for training / baseline cxr if we are going to follow you here > declined.  Consider referral to [facility] as above          Each maintenance medication was reviewed in detail including emphasizing most importantly the difference between maintenance and prns and under what circumstances the prns are to be triggered using an action plan format where appropriate.  Total time for H and P, chart review, counseling, reviewing flutter/ VEST device(s) and generating customized AVS unique to this office visit / same day charting  > 45 min

## 2022-01-01 NOTE — Progress Notes (Signed)
Thanks Dr Melvyn Novas. I agree. Best to tx her copd/bronchiectasis and avoid MAC tx at this time. Thanks for taking care of her

## 2022-01-10 DIAGNOSIS — Z1331 Encounter for screening for depression: Secondary | ICD-10-CM | POA: Diagnosis not present

## 2022-01-10 DIAGNOSIS — Z0000 Encounter for general adult medical examination without abnormal findings: Secondary | ICD-10-CM | POA: Diagnosis not present

## 2022-01-10 DIAGNOSIS — I10 Essential (primary) hypertension: Secondary | ICD-10-CM | POA: Diagnosis not present

## 2022-01-10 DIAGNOSIS — J449 Chronic obstructive pulmonary disease, unspecified: Secondary | ICD-10-CM | POA: Diagnosis not present

## 2022-01-10 DIAGNOSIS — I700 Atherosclerosis of aorta: Secondary | ICD-10-CM | POA: Diagnosis not present

## 2022-01-10 DIAGNOSIS — E7849 Other hyperlipidemia: Secondary | ICD-10-CM | POA: Diagnosis not present

## 2022-01-10 DIAGNOSIS — K219 Gastro-esophageal reflux disease without esophagitis: Secondary | ICD-10-CM | POA: Diagnosis not present

## 2022-01-17 DIAGNOSIS — J449 Chronic obstructive pulmonary disease, unspecified: Secondary | ICD-10-CM | POA: Diagnosis not present

## 2022-01-17 DIAGNOSIS — Z1331 Encounter for screening for depression: Secondary | ICD-10-CM | POA: Diagnosis not present

## 2022-01-17 DIAGNOSIS — Z79899 Other long term (current) drug therapy: Secondary | ICD-10-CM | POA: Diagnosis not present

## 2022-01-17 DIAGNOSIS — I700 Atherosclerosis of aorta: Secondary | ICD-10-CM | POA: Diagnosis not present

## 2022-01-17 DIAGNOSIS — K219 Gastro-esophageal reflux disease without esophagitis: Secondary | ICD-10-CM | POA: Diagnosis not present

## 2022-01-17 DIAGNOSIS — Z0000 Encounter for general adult medical examination without abnormal findings: Secondary | ICD-10-CM | POA: Diagnosis not present

## 2022-01-17 DIAGNOSIS — E7849 Other hyperlipidemia: Secondary | ICD-10-CM | POA: Diagnosis not present

## 2022-01-17 DIAGNOSIS — I10 Essential (primary) hypertension: Secondary | ICD-10-CM | POA: Diagnosis not present

## 2022-02-18 DIAGNOSIS — M5116 Intervertebral disc disorders with radiculopathy, lumbar region: Secondary | ICD-10-CM | POA: Diagnosis not present

## 2022-02-20 ENCOUNTER — Ambulatory Visit: Payer: Medicare Other | Admitting: Internal Medicine

## 2022-02-28 DIAGNOSIS — U071 COVID-19: Secondary | ICD-10-CM | POA: Diagnosis not present

## 2022-03-01 DIAGNOSIS — M48061 Spinal stenosis, lumbar region without neurogenic claudication: Secondary | ICD-10-CM | POA: Diagnosis not present

## 2022-03-01 DIAGNOSIS — M5116 Intervertebral disc disorders with radiculopathy, lumbar region: Secondary | ICD-10-CM | POA: Diagnosis not present

## 2022-03-01 DIAGNOSIS — M5416 Radiculopathy, lumbar region: Secondary | ICD-10-CM | POA: Diagnosis not present

## 2022-03-04 DIAGNOSIS — J329 Chronic sinusitis, unspecified: Secondary | ICD-10-CM | POA: Diagnosis not present

## 2022-03-04 DIAGNOSIS — J0100 Acute maxillary sinusitis, unspecified: Secondary | ICD-10-CM | POA: Diagnosis not present

## 2022-03-09 DIAGNOSIS — M48061 Spinal stenosis, lumbar region without neurogenic claudication: Secondary | ICD-10-CM | POA: Diagnosis not present

## 2022-03-09 DIAGNOSIS — M5116 Intervertebral disc disorders with radiculopathy, lumbar region: Secondary | ICD-10-CM | POA: Diagnosis not present

## 2022-03-09 DIAGNOSIS — M4807 Spinal stenosis, lumbosacral region: Secondary | ICD-10-CM | POA: Diagnosis not present

## 2022-03-20 ENCOUNTER — Ambulatory Visit: Payer: Medicare Other | Admitting: Internal Medicine

## 2022-04-03 DIAGNOSIS — M5416 Radiculopathy, lumbar region: Secondary | ICD-10-CM | POA: Diagnosis not present

## 2022-04-03 DIAGNOSIS — M545 Low back pain, unspecified: Secondary | ICD-10-CM | POA: Diagnosis not present

## 2022-04-11 DIAGNOSIS — I700 Atherosclerosis of aorta: Secondary | ICD-10-CM | POA: Diagnosis not present

## 2022-04-11 DIAGNOSIS — J449 Chronic obstructive pulmonary disease, unspecified: Secondary | ICD-10-CM | POA: Diagnosis not present

## 2022-04-11 DIAGNOSIS — K219 Gastro-esophageal reflux disease without esophagitis: Secondary | ICD-10-CM | POA: Diagnosis not present

## 2022-04-11 DIAGNOSIS — F411 Generalized anxiety disorder: Secondary | ICD-10-CM | POA: Diagnosis not present

## 2022-04-11 DIAGNOSIS — I10 Essential (primary) hypertension: Secondary | ICD-10-CM | POA: Diagnosis not present

## 2022-04-11 DIAGNOSIS — E7849 Other hyperlipidemia: Secondary | ICD-10-CM | POA: Diagnosis not present

## 2022-04-25 ENCOUNTER — Encounter: Payer: Self-pay | Admitting: Internal Medicine

## 2022-05-08 ENCOUNTER — Ambulatory Visit (INDEPENDENT_AMBULATORY_CARE_PROVIDER_SITE_OTHER): Payer: Medicare Other | Admitting: Internal Medicine

## 2022-05-08 ENCOUNTER — Encounter: Payer: Self-pay | Admitting: Internal Medicine

## 2022-05-08 DIAGNOSIS — R918 Other nonspecific abnormal finding of lung field: Secondary | ICD-10-CM | POA: Diagnosis not present

## 2022-05-08 DIAGNOSIS — J320 Chronic maxillary sinusitis: Secondary | ICD-10-CM

## 2022-05-08 DIAGNOSIS — J479 Bronchiectasis, uncomplicated: Secondary | ICD-10-CM | POA: Diagnosis not present

## 2022-05-08 NOTE — Patient Instructions (Signed)
No change in my instructions except you can try  Mucinex D over the counter and see if this    helps any of your nasal symptoms  ? ?My office will be contacting you by phone for referral to Quillian Quince in [place]  ? ?Plan on seeing Dr Redmond Baseman next available  ? ?Pulmonary follow up here is as needed. ?

## 2022-05-08 NOTE — Progress Notes (Signed)
? ?Suzanne Davis, female    DOB: 1941/06/25    MRN: [id number] ? ? ?Brief patient profile:  ?34 yowf quit smoking in 1981 referred to pulmonary clinic in [place]  12/26/2021 by Dr Gale Journey  for abnormal CT ? MAC with dx of bronchiectasis around 2012 by Dr [facility] eval rx saline / VEST / ? ?[facility] 2020 ?Initial [facility] evaluation October 04, 2019. Please see that note for details. In summary, 81 y.o. female, past smoker (approximately a pack per day for 15 years quit 1981) who presented for evaluation of multiple complaints including: chest pain, mucus production, palpitations, sinus congestion, ear fluid, throat clearing, and intermittent heartburn. In 2012, she was diagnosed with mycobacterium avium intracellulare (MAI) treated with azithromycin, ethambutol and rifampin but developed subsequent anorexia, worsening insomnia, and other constitutional symptoms. She was then restarted on treatment with EMB as a single agent after which she had significant flushing and high fevers; therefore, the effort to treat the MAI was abandoned.  ? ?She has been followed with serial chest CTs demonstrate bronchiectasis, pulmonary nodules, tree-in-bud infiltrates, left diaphragmatic hernia, as well as pleural/pericardial calcifications among those findings previous documented. In addition she had a PET-CT in 2011 that did not indicate malignancy. I only have the interpretative reports; I do not have the images to review. No PFTs are available. She is not currently on any treatment other than hypertonic saline which she uses every evening. No anti-microbials, bronchodilators, or oxygen. She uses Tums for the intermittent heartburn. ? ?Of note she had influenza vaccination September 2020. She has had both pneumococcal vaccinations.  ? ?Diagnostics were ordered. ? ?[facility] Evaluation: ?Blood work was unremarkable including CBC, chemistries, immunoglobulins, connective tissue disease screening, BNP, alpha-1 antitrypsin  level, and QuantiFERON TB. SARS-Co-V2 was negative. ? ?6MWT: 406 m on RA; Nadir SpO2 95% ?SHAPE VE/VCO2 28 ?PFTS October 06, 2019: BMI 20. TLC 89% predicted. FEV1 1.44 L, 63%; FVC 2.34 L, 78%; FEV1/FVC Ratio 61%; no significant change of bronchodilator; DLCO 13.6, 64%; SpO2 97%, decreasing to 93% with exercise. ?Overnight oximetry: Room air study. Average SpO2 90.8%. Oxygen desaturation index 13.8 events/hour. ?Chest CT: ?1. Diffuse bronchiectasis with multiple areas of airway centered/tree-in-bud nodules predominantly in the right lower and middle lobe consistent with reported history of atypical mycobacterial infection. ?2. Irregular nodule containing curvilinear high attenuation material size calcification in the right apex. Comparison to prior outside imaging when available would be helpful to confirm stability. ? ?ECG: Normal sinus rhythm. Left axis deviation. ?Echocardiography: Normal study except mildly sclerotic aortic valve with associated mild aortic valvular regurgitation. Specifically, normal left and right ventricle and no evidence of pulmonary hypertension. ? ?Impression and Recommendations: ?1. Mycobacterium Avium Intracellulare. Prior intolerance to antimycobacterial agents. Moderate obstructive disease by PFTs. Diffuse mild cylindrical bronchiectasis with more focal involvement in the right middle lobe and lingula. Multiple clusters of tree-in-bud infiltrates in the right middle and lower lobes. Prior chest CT--prior imaging studies are not available for comparison. Only current treatment is saline nebulization. We discussed adding a bronchodilator and Acapella vibratory device to promote pulmonary clearance. ?2. COPD. Prior history of smoking. Moderate but no evidence of significant hypoxemia or cor pulmonale. ?3. Abnormal Overnight Oximetry. Suggestive of moderate sleep disorder breathing. Recommend sleep medicine evaluation. ?4. Solitary Pulmonary Nodule. There is an irregular nodule in the right  apex, approximately 1.2 cm. Associated calcification and adjacent scarring suggest that the nodule is benign; however, comparison to outside imaging would be useful to confirm. She will mail  CD ROM with past imaging studies to [facility] for my review. ?5. History of elevated right heart pressures by echocardiography. Current studies are normal. She was reassured. No further evaluation is recommended. ? ? ?History of Present Illness  ?12/26/2021  Pulmonary/ 1st office eval/ Suzanne Davis / [facility]  ?Chief Complaint  ?Patient presents with  ? Consult  ?  Referred by Dr. Gale Journey for abnormal CT scan   ? Dyspnea:  Not limited by breathing from desired activities   ?Cough: variably discolored/ last bloody 2 weeks prior to ov ?Sleep: fine 30 degrees  ?SABA use: none  ?Rec ?Cough / congestion > mucinex dm 1200 mg every 12 hours as needed and use the flutter and the VEST as much as you can  ?We will be referring you to an ENT doctor in [place] and also I would recommend you see Dr Quillian Quince at [place] pulmonary department but will hold of on that for now  ?GERD diet reviewed, bed blocks rec  ?We will give you a cup to save the next time you get a really nasty dark green flare of mucus  (culture for afb and routine stain and culture will be drawn)  ? ?Please schedule a follow up visit in 3 months but call sooner if needed with all medications/flutter/solutions  ? ?ENT eval Redmond Baseman acute L max sinusitis with obstruction of the outflow track > rx augmentin and f/u p 10 d > did not return ? ? ?05/08/2022  f/u ov/[place] office/Suzanne Davis re: bronchiectasis/sinusitis maint on no rx  ?Chief Complaint  ?Patient presents with  ? Follow-up  ?  Cough has been bad since pollen has been out.   ?Dyspnea:  Not limited by breathing from desired activities   ?Cough: assoc with pnd with purulent nasal secretions  ?Sleeping: 30 degrees fine  ?SABA use: not using  ?02: none ?  ? ? ?No obvious day to day or daytime variability or assoc excess/  purulent sputum or mucus plugs or hemoptysis or cp or chest tightness, subjective wheeze or overt sinus or hb symptoms.  ? ?Sleeping as above without nocturnal  or early am exacerbation  of respiratory  c/o's or need for noct saba. Also denies any obvious fluctuation of symptoms with weather or environmental changes or other aggravating or alleviating factors except as outlined above  ? ?No unusual exposure hx or h/o childhood pna/ asthma or knowledge of premature birth. ? ?Current Allergies, Complete Past Medical History, Past Surgical History, Family History, and Social History were reviewed in [employer] record. ? ?ROS  The following are not active complaints unless bolded ?Hoarseness, sore throat, dysphagia, dental problems, itching, sneezing,  nasal congestion or discharge of excess mucus or purulent secretions, ear ache,   fever, chills, sweats, unintended wt loss or wt gain, classically pleuritic or exertional cp,  orthopnea pnd or arm/hand swelling  or leg swelling, presyncope, palpitations, abdominal pain, anorexia, nausea, vomiting, diarrhea  or change in bowel habits or change in bladder habits, change in stools or change in urine, dysuria, hematuria,  rash, arthralgias, visual complaints, headache, numbness, weakness or ataxia or problems with walking or coordination,  change in mood or  memory. ?      ? ?Current Meds -  - NOTE:   Unable to verify as accurately reflecting what pt takes  - pt does not recall what abx she's been taking or when she finished them  ?Medication Sig  ? Ascorbic Acid (VITAMIN C WITH ROSE  HIPS) 500 MG tablet Take 500 mg by mouth daily.  ? aspirin 81 MG chewable tablet Chew by mouth daily.  ? atorvastatin (LIPITOR) 10 MG tablet Take 10 mg by mouth daily.  ? Biotin 10000 MCG TABS Take by mouth daily.  ? Cholecalciferol (VITAMIN D3) 10 MCG (400 UNIT) CAPS Take 2,000 Units by mouth daily.  ? Coenzyme Q10 (COQ10) 200 MG CAPS Take by mouth.  ?  guaiFENesin-codeine 100-10 MG/5ML syrup Take 5 mLs by mouth every 6 (six) hours as needed for cough.  ? LORazepam (ATIVAN) 1 MG tablet Take 1 mg by mouth at bedtime.  ? metoprolol succinate (TOPROL-XL) 25 MG 24 hr tab

## 2022-05-09 ENCOUNTER — Encounter: Payer: Self-pay | Admitting: Internal Medicine

## 2022-05-09 DIAGNOSIS — J320 Chronic maxillary sinusitis: Secondary | ICD-10-CM | POA: Insufficient documentation

## 2022-05-09 NOTE — Assessment & Plan Note (Signed)
ENT eval Redmond Baseman acute L max sinusitis with obstruction of the outflow track > rx augmentin and f/u p 10 d > did not return > referred back 05/08/2022  ? ?reviewd nasal anatomy in relation to Sinus ostia and how difficult it is to know whether sinuses are contributing to her problem just by how well she can breathe thru her nose.  However, did add mucinex d to her recs and time she has pain over the upper teeth or nasal congestion or any of the other non-specific complaints that might indicate active sinusitis. (HA in AM, worse bending over, increased nasal secretions esp if purulent ) and defer further abx rx to Dr Redmond Baseman. ? ?    ?  ? ?Each maintenance medication was reviewed in detail including emphasizing most importantly the difference between maintenance and prns and under what circumstances the prns are to be triggered using an action plan format where appropriate. ? ?Total time for H and P, chart review, counseling, reviewing flutter, nettipot device(s) and generating customized AVS unique to this office visit / same day charting = 32 min ?     ?

## 2022-05-09 NOTE — Assessment & Plan Note (Signed)
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 TLC 89% predicted. FEV1 1.44 L, 63%; FVC 2.34 L, 78%; FEV1/FVC Ratio 61%; no significant change of bronchodilator; DLCO 13.6, 64%; ?- 12/26/2021 rec mucinex dm/ flutter/ VEST and am sputum with next flare  ? ?.presently not using bronchodilators  And Not limited by breathing from desired activities  With clear lungs on exam >  No change in recs ?

## 2022-05-09 NOTE — Assessment & Plan Note (Signed)
Chest CT w/o contrast 10/24/21 ?1. Similar appearance of bronchiectasis with tree-in-bud opacity in  ?the right middle lobe and right lower lobe with decrease in  ?tree-in-bud nodularity seen previously in the posterior right lower  ?lobe. Imaging features are compatible with sequelae of atypical  ?infection.  ? ?This seems to be less of a problem presently than her sinus dz but so far has declined my recs re either getting a cxr or submitting a sputum sample and I note she has already been to the [facility] clinic and unlikely therefore to be happy with her care in [place] so best to refer to our regional expert at [facility] > referred to Dr Quillian Quince at [facility] and we can see her prn for flares here. ?

## 2022-05-15 ENCOUNTER — Other Ambulatory Visit: Payer: Medicare Other

## 2022-05-15 ENCOUNTER — Encounter: Payer: Self-pay | Admitting: Internal Medicine

## 2022-05-15 ENCOUNTER — Ambulatory Visit (INDEPENDENT_AMBULATORY_CARE_PROVIDER_SITE_OTHER): Payer: Medicare Other | Admitting: Internal Medicine

## 2022-05-15 VITALS — BP 150/60 | HR 77 | Ht 68.0 in | Wt 133.0 lb

## 2022-05-15 DIAGNOSIS — R109 Unspecified abdominal pain: Secondary | ICD-10-CM

## 2022-05-15 DIAGNOSIS — Z9049 Acquired absence of other specified parts of digestive tract: Secondary | ICD-10-CM | POA: Diagnosis not present

## 2022-05-15 DIAGNOSIS — R140 Abdominal distension (gaseous): Secondary | ICD-10-CM

## 2022-05-15 DIAGNOSIS — R197 Diarrhea, unspecified: Secondary | ICD-10-CM

## 2022-05-15 MED ORDER — COLESTIPOL HCL 1 G PO TABS: 2.0000 g | ORAL_TABLET | Freq: Two times a day (BID) | ORAL | 1 refills | Status: DC

## 2022-05-15 NOTE — Patient Instructions (Signed)
If you are age 79 or older, your body mass index should be between 23-30. Your Body mass index is 20.22 kg/m?Marland Kitchen If this is out of the aforementioned range listed, please consider follow up with your Primary Care Provider. ? ?If you are age 74 or younger, your body mass index should be between 19-25. Your Body mass index is 20.22 kg/m?Marland Kitchen If this is out of the aformentioned range listed, please consider follow up with your Primary Care Provider.  ? ?We have sent the following medications to your pharmacy for you to pick up at your convenience: ?Colestipol 45m twice daily. ? ?Please follow Fodmap given. ? ?You have been given a testing kit to check for small intestine bacterial overgrowth (SIBO) which is completed by a company named [facility]. Make sure to return your test in the mail using the return mailing label given to you along with the kit. Your demographic and insurance information have already been sent to the company and they should be in contact with you over the next 1-2 weeks regarding this test. [facility] will collect an upfront charge of $99.74 for commercial insurance plans and $209.74 is you are paying cash. Make sure to discuss with [facility] PRIOR to having the test to see if they have gotten information from your insurance company as to how much your testing will cost out of pocket, if any. Please keep in mind that you will be getting a call from phone number [phone] a similar number. If you do not hear from them within this time frame, please call our office at [phone] call [facility] directly at [phone]   ? ?The [facility] providers would like to encourage you to use [facility] providers for non-urgent requests or questions.  Due to long hold times on the telephone, sending your provider a message by [facility] be a faster and more efficient way to get a response.  Please allow 48 business hours for a response.  Please remember that  this is for non-urgent requests.  ? ?Due to recent changes in healthcare laws, you may see the results of your imaging and laboratory studies on MyChart before your provider has had a chance to review them.  We understand that in some cases there may be results that are confusing or concerning to you. Not all laboratory results come back in the same time frame and the provider may be waiting for multiple results in order to interpret others.  Please give [id number] hours in order for your provider to thoroughly review all the results before contacting the office for clarification of your results.  ? ?Thank you for entrusting me with your care and for choosing [facility] ?Dr. CChristia Reading? ? ?

## 2022-05-15 NOTE — Progress Notes (Addendum)
Chief Complaint: Diarrhea, right abdominal pain  HPI : 81 year old female with history of COPD, IBS, OSA, pSVT, melanoma, anxiety presents with diarrhea and right abdominal pain  She has had diarrhea ever since she came off of opioids about 1.5 years ago. Endorses 3-4 BMs per day. She had the flu in 02/2022 and was having watery stools with fecal incontinence when she was dealing with the infection. Her stools have become more loose rather than watery since then. Every time that she eats, she has to go to the bathroom. She has tried Imodium but was fearful of the side effects of causing arrhthymias. He has been on antibiotics recently about 2 weeks ago. She has acid reflux symptoms on occasion depending on what she eats. She sleeps at an incline to help with her GERD. Denies N&V, blood in stools. She has trouble swallowing pills at times. Endorses bloating. Has also had longstanding pain under her right rib cage. She has had a cholecystectomy in the past.  Past Medical History:  Diagnosis Date   Abdominal pain 11/25/2018   Anxiety    COPD (chronic obstructive pulmonary disease) ([place]) 09/2019   Gallstones    Hyperlipidemia    Hypertension    IBS (irritable bowel syndrome) 1998   Melanoma ([place]) 2017   right arm   Sciatica    Sleep apnea 2022   Supraventricular tachycardia, paroxysmal ([place]) 1989   Past Surgical History:  Procedure Laterality Date   CHOLECYSTECTOMY  1990   at same time removed benign liver tumor   colonoscopy 2017     right shoulder spurs surgery  2000   Family History  Problem Relation Age of Onset   Diabetes Mother    Heart disease Mother    Prostate cancer Father    Colon cancer Neg Hx    Esophageal cancer Neg Hx    Rectal cancer Neg Hx    Social History   Tobacco Use   Smoking status: Former    Types: Cigarettes   Smokeless tobacco: Never   Tobacco comments:    Quit in 01/1980  Vaping Use   Vaping Use: Never used  Substance Use Topics   Alcohol use:  Never   Drug use: Never   Current Outpatient Medications  Medication Sig Dispense Refill   Ascorbic Acid (VITAMIN C WITH ROSE HIPS) 500 MG tablet Take 500 mg by mouth daily.     aspirin 81 MG chewable tablet Chew by mouth daily.     atorvastatin (LIPITOR) 10 MG tablet Take 10 mg by mouth daily.     Biotin 10000 MCG TABS Take by mouth daily.     Cholecalciferol (VITAMIN D3) 10 MCG (400 UNIT) CAPS Take 2,000 Units by mouth daily.     Coenzyme Q10 (COQ10) 200 MG CAPS Take by mouth.     Cyanocobalamin (VITAMIN B12) 3000 MCG SUBL Place under the tongue. Takes one on even days     guaiFENesin-codeine 100-10 MG/5ML syrup Take 5 mLs by mouth every 6 (six) hours as needed for cough. 120 mL 0   LORazepam (ATIVAN) 1 MG tablet Take 1 mg by mouth at bedtime.     metoprolol succinate (TOPROL-XL) 25 MG 24 hr tablet Take 25 mg by mouth daily.     Multiple Vitamin (MULTIVITAMIN) tablet Take 1 tablet by mouth daily.     nitroGLYCERIN (NITROSTAT) 0.4 MG SL tablet nitroglycerin 0.4 mg sublingual tablet  prn     Omega-3 Fatty Acids (FISH OIL) 1000 MG  CPDR Take by mouth.     vitamin E 180 MG (400 UNITS) capsule Take 400 Units by mouth daily.     No current facility-administered medications for this visit.   Allergies  Allergen Reactions   Ethambutol Other (See Comments)    High Fever--confirmed with sequential rechallenge   Tetracycline Hives   Doxycycline Other (See Comments)    Mouth soreness      Review of Systems: All systems reviewed and negative except where noted in HPI.   Physical Exam: BP (!) 150/60   Pulse 77   Ht '5\' 8"'$  (1.727 m)   Wt 133 lb (60.3 kg)   BMI 20.22 kg/m  Constitutional: Pleasant,well-developed, female in no acute distress. HEENT: Normocephalic and atraumatic. Conjunctivae are normal. No scleral icterus. Cardiovascular: Normal rate, regular rhythm.  Pulmonary/chest: Effort normal and breath sounds normal. No wheezing, rales or rhonchi. Abdominal: Soft, nondistended,  tender in the RUQ Bowel sounds active throughout. There are no masses palpable. No hepatomegaly. Extremities: No edema Neurological: Alert and oriented to person place and time. Skin: Skin is warm and dry. No rashes noted. Psychiatric: Normal mood and affect. Behavior is normal.  Labs 09/2019: CBC and CMP unremarkable. ANA nml. IgG nml.   CT A/P w/contrast 02/02/19: IMPRESSION: 1. No acute or focal lesions to explain abdominal pain. 2. Right lower lobe pulmonary nodules are new, measuring up to 7 x 11 mm. Recommend CT the chest with contrast for further evaluation. While the patient has had previous nodular disease, neoplasm is not excluded. 3. Benign bilateral upper pole renal cysts. 4. Diffuse urinary bladder wall thickening without a focal lesion. This may represent chronic inflammatory change or cystitis. 5.  Aortic Atherosclerosis (ICD10-I70.0). 6. Degenerative changes of the lumbar spine.  Abd U/S 09/06/21: IMPRESSION: 1. Postcholecystectomy. No abnormal biliary dilatation postcholecystectomy. 2. Small cysts in the liver and kidneys. 3. Otherwise unremarkable abdominal ultrasound. No explanation for pain.  EGD 08/16/13: Mild gastritis. Schatzki's ring dilated to 18 mm.   Colonoscopy 08/16/13: Mild diverticulosis in left colon. Two polyps found in ascending colon and hepatic flexure. Path: Tubular adenoma and benign colonic epithelium  ASSESSMENT AND PLAN: Diarrhea Bloating History of cholecystectomy Right sided abdominal pain Patient presents with diarrhea after a recent flu infection in 02/2022. I do suspect that the patient may be experiencing postinfectious IBS symptoms.  She has had improvement in her diarrhea over time.  Patient does have history of cholecystectomy and thus may also have some underlying bile acid diarrhea.  Thus we will go ahead and start the patient on a bile acid binder to see if this helps with her symptoms.  Other possible causes of her symptoms could  include infectious gastroenteritis and/or SIBO (patient recently underwent antibiotics therapy).  I have also recommended that the patient try a low FODMAP diet to see if this helps with her symptoms as well. Patient has chronic RUQ ab pain that may be related to prior adhesions from removal of a liver tumor and her gallbladder. - Low FODMAP diet - Start colestipol 2 mg BID - Check GI profile, O&P, C dif PCR - Hydrogen breath test  Christia Reading, MD

## 2022-05-20 ENCOUNTER — Other Ambulatory Visit: Payer: Medicare Other

## 2022-05-20 DIAGNOSIS — A09 Infectious gastroenteritis and colitis, unspecified: Secondary | ICD-10-CM | POA: Diagnosis not present

## 2022-05-20 DIAGNOSIS — R109 Unspecified abdominal pain: Secondary | ICD-10-CM | POA: Diagnosis not present

## 2022-05-20 DIAGNOSIS — R197 Diarrhea, unspecified: Secondary | ICD-10-CM | POA: Diagnosis not present

## 2022-05-20 DIAGNOSIS — Z9049 Acquired absence of other specified parts of digestive tract: Secondary | ICD-10-CM | POA: Diagnosis not present

## 2022-05-20 DIAGNOSIS — R140 Abdominal distension (gaseous): Secondary | ICD-10-CM | POA: Diagnosis not present

## 2022-05-22 ENCOUNTER — Other Ambulatory Visit: Payer: Self-pay | Admitting: Internal Medicine

## 2022-05-22 LAB — GI PROFILE, STOOL, PCR

## 2022-05-23 LAB — OVA AND PARASITE EXAMINATION
CONCENTRATE RESULT:: NONE SEEN
MICRO NUMBER:: 13427393
SPECIMEN QUALITY:: ADEQUATE
TRICHROME RESULT:: NONE SEEN

## 2022-05-23 LAB — CLOSTRIDIUM DIFFICILE BY PCR: Toxigenic C. Difficile by PCR: NEGATIVE

## 2022-05-31 ENCOUNTER — Telehealth: Payer: Self-pay | Admitting: Internal Medicine

## 2022-05-31 NOTE — Telephone Encounter (Signed)
Returned pt call to inquire further. States she has the following concerns:  "These pills are too big for me to swallow", continued to state, "the doctor knows I have trouble swallowing" further added, "what does she intend to do about that" "This medication is prescribed for cholesterol but that's not why I am taking it", further adding "I'm already prescribed medications for cholesterol" "This medication states it will cause constipation, abd pain and cramping in the joints" further adding, "and I don't want to take a medication that will do this" "Labs are supposed to be checked while on this medication", further adding, "does she intend to check the labs?"  Education provided about why this medication is being prescribed and how it addresses GI related issues/conditions, why the side effects are listed as well as how the side effects could be relevant in certain situations and finally why labs were not required at the time the medication was prescribed and what would be monitored in the future given her unique circumstances. Verbalized acceptance and understanding of all information provided. Pt states her biggest concern is about the size of the pills and her dysphagia. Advised I will forward her concern to Dr. Lorenso Courier for her to further address. Expressed appreciation.

## 2022-05-31 NOTE — Telephone Encounter (Signed)
Inbound call from patient stating she would like a call back to discuss Colestipol. States she has some concerns about the medication. Please advise.

## 2022-06-03 MED ORDER — CHOLESTYRAMINE 4 G PO PACK: 2.0000 g | PACK | Freq: Two times a day (BID) | ORAL | 12 refills | Status: DC

## 2022-06-03 NOTE — Telephone Encounter (Signed)
The pt has been advised of the new prescription that has been sent.  She will call with any further concerns.

## 2022-07-11 DIAGNOSIS — E7849 Other hyperlipidemia: Secondary | ICD-10-CM | POA: Diagnosis not present

## 2022-07-11 DIAGNOSIS — K219 Gastro-esophageal reflux disease without esophagitis: Secondary | ICD-10-CM | POA: Diagnosis not present

## 2022-07-11 DIAGNOSIS — I700 Atherosclerosis of aorta: Secondary | ICD-10-CM | POA: Diagnosis not present

## 2022-07-11 DIAGNOSIS — I10 Essential (primary) hypertension: Secondary | ICD-10-CM | POA: Diagnosis not present

## 2022-07-11 DIAGNOSIS — F411 Generalized anxiety disorder: Secondary | ICD-10-CM | POA: Diagnosis not present

## 2022-07-12 DIAGNOSIS — Z823 Family history of stroke: Secondary | ICD-10-CM | POA: Diagnosis not present

## 2022-07-12 DIAGNOSIS — Z87891 Personal history of nicotine dependence: Secondary | ICD-10-CM | POA: Diagnosis not present

## 2022-07-12 DIAGNOSIS — R059 Cough, unspecified: Secondary | ICD-10-CM | POA: Diagnosis not present

## 2022-07-12 DIAGNOSIS — A310 Pulmonary mycobacterial infection: Secondary | ICD-10-CM | POA: Diagnosis not present

## 2022-07-12 DIAGNOSIS — R509 Fever, unspecified: Secondary | ICD-10-CM | POA: Diagnosis not present

## 2022-07-12 DIAGNOSIS — R0981 Nasal congestion: Secondary | ICD-10-CM | POA: Diagnosis not present

## 2022-07-12 DIAGNOSIS — J3489 Other specified disorders of nose and nasal sinuses: Secondary | ICD-10-CM | POA: Diagnosis not present

## 2022-07-12 DIAGNOSIS — I10 Essential (primary) hypertension: Secondary | ICD-10-CM | POA: Diagnosis not present

## 2022-07-12 DIAGNOSIS — Z7982 Long term (current) use of aspirin: Secondary | ICD-10-CM | POA: Diagnosis not present

## 2022-07-12 DIAGNOSIS — K449 Diaphragmatic hernia without obstruction or gangrene: Secondary | ICD-10-CM | POA: Diagnosis not present

## 2022-07-12 DIAGNOSIS — J479 Bronchiectasis, uncomplicated: Secondary | ICD-10-CM | POA: Diagnosis not present

## 2022-07-12 DIAGNOSIS — G4700 Insomnia, unspecified: Secondary | ICD-10-CM | POA: Diagnosis not present

## 2022-07-12 DIAGNOSIS — Z833 Family history of diabetes mellitus: Secondary | ICD-10-CM | POA: Diagnosis not present

## 2022-07-12 DIAGNOSIS — Z8249 Family history of ischemic heart disease and other diseases of the circulatory system: Secondary | ICD-10-CM | POA: Diagnosis not present

## 2022-07-12 DIAGNOSIS — R0982 Postnasal drip: Secondary | ICD-10-CM | POA: Diagnosis not present

## 2022-07-12 DIAGNOSIS — R042 Hemoptysis: Secondary | ICD-10-CM | POA: Diagnosis not present

## 2022-09-05 DIAGNOSIS — R0789 Other chest pain: Secondary | ICD-10-CM | POA: Diagnosis not present

## 2022-09-05 DIAGNOSIS — R079 Chest pain, unspecified: Secondary | ICD-10-CM | POA: Diagnosis not present

## 2022-10-01 DIAGNOSIS — L570 Actinic keratosis: Secondary | ICD-10-CM | POA: Diagnosis not present

## 2022-10-01 DIAGNOSIS — D045 Carcinoma in situ of skin of trunk: Secondary | ICD-10-CM | POA: Diagnosis not present

## 2022-10-01 DIAGNOSIS — D485 Neoplasm of uncertain behavior of skin: Secondary | ICD-10-CM | POA: Diagnosis not present

## 2022-10-09 DIAGNOSIS — Z23 Encounter for immunization: Secondary | ICD-10-CM | POA: Diagnosis not present

## 2022-10-10 DIAGNOSIS — I10 Essential (primary) hypertension: Secondary | ICD-10-CM | POA: Diagnosis not present

## 2022-10-10 DIAGNOSIS — I700 Atherosclerosis of aorta: Secondary | ICD-10-CM | POA: Diagnosis not present

## 2022-10-10 DIAGNOSIS — C44529 Squamous cell carcinoma of skin of other part of trunk: Secondary | ICD-10-CM | POA: Diagnosis not present

## 2022-10-10 DIAGNOSIS — F411 Generalized anxiety disorder: Secondary | ICD-10-CM | POA: Diagnosis not present

## 2022-10-10 DIAGNOSIS — D045 Carcinoma in situ of skin of trunk: Secondary | ICD-10-CM | POA: Diagnosis not present

## 2022-10-10 DIAGNOSIS — K219 Gastro-esophageal reflux disease without esophagitis: Secondary | ICD-10-CM | POA: Diagnosis not present

## 2022-10-10 DIAGNOSIS — E7849 Other hyperlipidemia: Secondary | ICD-10-CM | POA: Diagnosis not present

## 2022-10-10 DIAGNOSIS — L905 Scar conditions and fibrosis of skin: Secondary | ICD-10-CM | POA: Diagnosis not present

## 2022-10-24 DIAGNOSIS — K219 Gastro-esophageal reflux disease without esophagitis: Secondary | ICD-10-CM | POA: Diagnosis not present

## 2022-10-24 DIAGNOSIS — E7849 Other hyperlipidemia: Secondary | ICD-10-CM | POA: Diagnosis not present

## 2022-10-24 DIAGNOSIS — I700 Atherosclerosis of aorta: Secondary | ICD-10-CM | POA: Diagnosis not present

## 2022-10-24 DIAGNOSIS — I10 Essential (primary) hypertension: Secondary | ICD-10-CM | POA: Diagnosis not present

## 2022-10-24 DIAGNOSIS — F411 Generalized anxiety disorder: Secondary | ICD-10-CM | POA: Diagnosis not present

## 2022-11-05 DIAGNOSIS — H04123 Dry eye syndrome of bilateral lacrimal glands: Secondary | ICD-10-CM | POA: Diagnosis not present

## 2022-11-08 DIAGNOSIS — Z1329 Encounter for screening for other suspected endocrine disorder: Secondary | ICD-10-CM | POA: Diagnosis not present

## 2023-01-03 DIAGNOSIS — Z79899 Other long term (current) drug therapy: Secondary | ICD-10-CM | POA: Diagnosis not present

## 2023-01-03 DIAGNOSIS — E785 Hyperlipidemia, unspecified: Secondary | ICD-10-CM | POA: Diagnosis not present

## 2023-01-03 DIAGNOSIS — Z87891 Personal history of nicotine dependence: Secondary | ICD-10-CM | POA: Diagnosis not present

## 2023-01-03 DIAGNOSIS — Z23 Encounter for immunization: Secondary | ICD-10-CM | POA: Diagnosis not present

## 2023-01-03 DIAGNOSIS — R067 Sneezing: Secondary | ICD-10-CM | POA: Diagnosis not present

## 2023-01-03 DIAGNOSIS — I10 Essential (primary) hypertension: Secondary | ICD-10-CM | POA: Diagnosis not present

## 2023-01-03 DIAGNOSIS — J479 Bronchiectasis, uncomplicated: Secondary | ICD-10-CM | POA: Diagnosis not present

## 2023-01-03 DIAGNOSIS — A310 Pulmonary mycobacterial infection: Secondary | ICD-10-CM | POA: Diagnosis not present

## 2023-01-03 DIAGNOSIS — Z7982 Long term (current) use of aspirin: Secondary | ICD-10-CM | POA: Diagnosis not present

## 2023-01-03 DIAGNOSIS — Z72820 Sleep deprivation: Secondary | ICD-10-CM | POA: Diagnosis not present

## 2023-01-03 DIAGNOSIS — J449 Chronic obstructive pulmonary disease, unspecified: Secondary | ICD-10-CM | POA: Diagnosis not present

## 2023-01-03 DIAGNOSIS — G4700 Insomnia, unspecified: Secondary | ICD-10-CM | POA: Diagnosis not present

## 2023-01-09 DIAGNOSIS — I10 Essential (primary) hypertension: Secondary | ICD-10-CM | POA: Diagnosis not present

## 2023-01-09 DIAGNOSIS — K219 Gastro-esophageal reflux disease without esophagitis: Secondary | ICD-10-CM | POA: Diagnosis not present

## 2023-01-09 DIAGNOSIS — E7849 Other hyperlipidemia: Secondary | ICD-10-CM | POA: Diagnosis not present

## 2023-01-09 DIAGNOSIS — I700 Atherosclerosis of aorta: Secondary | ICD-10-CM | POA: Diagnosis not present

## 2023-01-09 DIAGNOSIS — F411 Generalized anxiety disorder: Secondary | ICD-10-CM | POA: Diagnosis not present

## 2023-03-28 DIAGNOSIS — K529 Noninfective gastroenteritis and colitis, unspecified: Secondary | ICD-10-CM | POA: Diagnosis not present

## 2023-03-28 DIAGNOSIS — R1310 Dysphagia, unspecified: Secondary | ICD-10-CM | POA: Diagnosis not present

## 2023-04-16 DIAGNOSIS — Z85820 Personal history of malignant melanoma of skin: Secondary | ICD-10-CM | POA: Diagnosis not present

## 2023-04-16 DIAGNOSIS — E7849 Other hyperlipidemia: Secondary | ICD-10-CM | POA: Diagnosis not present

## 2023-04-16 DIAGNOSIS — K219 Gastro-esophageal reflux disease without esophagitis: Secondary | ICD-10-CM | POA: Diagnosis not present

## 2023-04-16 DIAGNOSIS — D239 Other benign neoplasm of skin, unspecified: Secondary | ICD-10-CM | POA: Diagnosis not present

## 2023-04-16 DIAGNOSIS — R202 Paresthesia of skin: Secondary | ICD-10-CM | POA: Diagnosis not present

## 2023-04-16 DIAGNOSIS — F411 Generalized anxiety disorder: Secondary | ICD-10-CM | POA: Diagnosis not present

## 2023-04-16 DIAGNOSIS — Z1331 Encounter for screening for depression: Secondary | ICD-10-CM | POA: Diagnosis not present

## 2023-04-16 DIAGNOSIS — Z85828 Personal history of other malignant neoplasm of skin: Secondary | ICD-10-CM | POA: Diagnosis not present

## 2023-04-16 DIAGNOSIS — L570 Actinic keratosis: Secondary | ICD-10-CM | POA: Diagnosis not present

## 2023-04-16 DIAGNOSIS — Z0000 Encounter for general adult medical examination without abnormal findings: Secondary | ICD-10-CM | POA: Diagnosis not present

## 2023-04-16 DIAGNOSIS — I10 Essential (primary) hypertension: Secondary | ICD-10-CM | POA: Diagnosis not present

## 2023-04-16 DIAGNOSIS — I700 Atherosclerosis of aorta: Secondary | ICD-10-CM | POA: Diagnosis not present

## 2023-05-28 DIAGNOSIS — J309 Allergic rhinitis, unspecified: Secondary | ICD-10-CM | POA: Diagnosis not present

## 2023-05-28 DIAGNOSIS — J449 Chronic obstructive pulmonary disease, unspecified: Secondary | ICD-10-CM | POA: Diagnosis not present

## 2023-05-28 DIAGNOSIS — J479 Bronchiectasis, uncomplicated: Secondary | ICD-10-CM | POA: Diagnosis not present

## 2023-07-07 DIAGNOSIS — J479 Bronchiectasis, uncomplicated: Secondary | ICD-10-CM | POA: Diagnosis not present

## 2023-07-09 DIAGNOSIS — R918 Other nonspecific abnormal finding of lung field: Secondary | ICD-10-CM | POA: Diagnosis not present

## 2023-07-16 DIAGNOSIS — I10 Essential (primary) hypertension: Secondary | ICD-10-CM | POA: Diagnosis not present

## 2023-07-16 DIAGNOSIS — K219 Gastro-esophageal reflux disease without esophagitis: Secondary | ICD-10-CM | POA: Diagnosis not present

## 2023-07-16 DIAGNOSIS — R5383 Other fatigue: Secondary | ICD-10-CM | POA: Diagnosis not present

## 2023-07-16 DIAGNOSIS — I700 Atherosclerosis of aorta: Secondary | ICD-10-CM | POA: Diagnosis not present

## 2023-07-16 DIAGNOSIS — F411 Generalized anxiety disorder: Secondary | ICD-10-CM | POA: Diagnosis not present

## 2023-07-16 DIAGNOSIS — E7849 Other hyperlipidemia: Secondary | ICD-10-CM | POA: Diagnosis not present

## 2023-08-05 DIAGNOSIS — R197 Diarrhea, unspecified: Secondary | ICD-10-CM | POA: Diagnosis not present

## 2023-08-06 DIAGNOSIS — J309 Allergic rhinitis, unspecified: Secondary | ICD-10-CM | POA: Diagnosis not present

## 2023-08-06 DIAGNOSIS — J449 Chronic obstructive pulmonary disease, unspecified: Secondary | ICD-10-CM | POA: Diagnosis not present

## 2023-08-06 DIAGNOSIS — J479 Bronchiectasis, uncomplicated: Secondary | ICD-10-CM | POA: Diagnosis not present

## 2023-10-15 DIAGNOSIS — F411 Generalized anxiety disorder: Secondary | ICD-10-CM | POA: Diagnosis not present

## 2023-10-15 DIAGNOSIS — K219 Gastro-esophageal reflux disease without esophagitis: Secondary | ICD-10-CM | POA: Diagnosis not present

## 2023-10-15 DIAGNOSIS — M5430 Sciatica, unspecified side: Secondary | ICD-10-CM | POA: Diagnosis not present

## 2023-10-15 DIAGNOSIS — I10 Essential (primary) hypertension: Secondary | ICD-10-CM | POA: Diagnosis not present

## 2023-10-15 DIAGNOSIS — E7849 Other hyperlipidemia: Secondary | ICD-10-CM | POA: Diagnosis not present

## 2023-10-15 DIAGNOSIS — I700 Atherosclerosis of aorta: Secondary | ICD-10-CM | POA: Diagnosis not present

## 2023-10-21 DIAGNOSIS — D0461 Carcinoma in situ of skin of right upper limb, including shoulder: Secondary | ICD-10-CM | POA: Diagnosis not present

## 2023-10-21 DIAGNOSIS — L570 Actinic keratosis: Secondary | ICD-10-CM | POA: Diagnosis not present

## 2023-10-21 DIAGNOSIS — Z85828 Personal history of other malignant neoplasm of skin: Secondary | ICD-10-CM | POA: Diagnosis not present

## 2023-10-21 DIAGNOSIS — Z85820 Personal history of malignant melanoma of skin: Secondary | ICD-10-CM | POA: Diagnosis not present

## 2023-10-21 DIAGNOSIS — D485 Neoplasm of uncertain behavior of skin: Secondary | ICD-10-CM | POA: Diagnosis not present

## 2023-10-24 DIAGNOSIS — R5383 Other fatigue: Secondary | ICD-10-CM | POA: Diagnosis not present

## 2023-10-24 DIAGNOSIS — Z79899 Other long term (current) drug therapy: Secondary | ICD-10-CM | POA: Diagnosis not present

## 2023-10-24 DIAGNOSIS — E7849 Other hyperlipidemia: Secondary | ICD-10-CM | POA: Diagnosis not present

## 2023-11-03 DIAGNOSIS — Z23 Encounter for immunization: Secondary | ICD-10-CM | POA: Diagnosis not present

## 2023-11-06 DIAGNOSIS — C4361 Malignant melanoma of right upper limb, including shoulder: Secondary | ICD-10-CM | POA: Diagnosis not present

## 2023-11-06 DIAGNOSIS — D0361 Melanoma in situ of right upper limb, including shoulder: Secondary | ICD-10-CM | POA: Diagnosis not present

## 2023-11-20 DIAGNOSIS — C44622 Squamous cell carcinoma of skin of right upper limb, including shoulder: Secondary | ICD-10-CM | POA: Diagnosis not present

## 2024-01-14 DIAGNOSIS — M5430 Sciatica, unspecified side: Secondary | ICD-10-CM | POA: Diagnosis not present

## 2024-01-14 DIAGNOSIS — K219 Gastro-esophageal reflux disease without esophagitis: Secondary | ICD-10-CM | POA: Diagnosis not present

## 2024-01-14 DIAGNOSIS — E7849 Other hyperlipidemia: Secondary | ICD-10-CM | POA: Diagnosis not present

## 2024-01-14 DIAGNOSIS — F411 Generalized anxiety disorder: Secondary | ICD-10-CM | POA: Diagnosis not present

## 2024-01-14 DIAGNOSIS — N182 Chronic kidney disease, stage 2 (mild): Secondary | ICD-10-CM | POA: Diagnosis not present

## 2024-01-14 DIAGNOSIS — K921 Melena: Secondary | ICD-10-CM | POA: Diagnosis not present

## 2024-01-14 DIAGNOSIS — J305 Allergic rhinitis due to food: Secondary | ICD-10-CM | POA: Diagnosis not present

## 2024-01-14 DIAGNOSIS — I700 Atherosclerosis of aorta: Secondary | ICD-10-CM | POA: Diagnosis not present

## 2024-01-14 DIAGNOSIS — I10 Essential (primary) hypertension: Secondary | ICD-10-CM | POA: Diagnosis not present

## 2024-01-22 ENCOUNTER — Encounter (INDEPENDENT_AMBULATORY_CARE_PROVIDER_SITE_OTHER): Payer: Self-pay | Admitting: *Deleted

## 2024-02-04 ENCOUNTER — Ambulatory Visit (INDEPENDENT_AMBULATORY_CARE_PROVIDER_SITE_OTHER): Payer: Medicare Other | Admitting: Gastroenterology

## 2024-03-01 ENCOUNTER — Encounter (INDEPENDENT_AMBULATORY_CARE_PROVIDER_SITE_OTHER): Payer: Self-pay | Admitting: Gastroenterology

## 2024-03-01 ENCOUNTER — Ambulatory Visit (INDEPENDENT_AMBULATORY_CARE_PROVIDER_SITE_OTHER): Payer: Medicare Other | Admitting: Gastroenterology

## 2024-03-01 VITALS — BP 145/66 | HR 60 | Temp 97.2°F | Ht 68.0 in | Wt 132.6 lb

## 2024-03-01 DIAGNOSIS — R195 Other fecal abnormalities: Secondary | ICD-10-CM | POA: Diagnosis not present

## 2024-03-01 NOTE — Patient Instructions (Addendum)
 Start Benefiber fiber supplements daily to increase stool bulk If persisting symptoms on follow up, will need to proceed with fecal testing to rule out EPI, TSH and colonoscopy

## 2024-03-01 NOTE — Progress Notes (Signed)
 Katrinka Blazing, M.D. Gastroenterology & Hepatology [facility] [address] [place], [place] [address] Primary Care Physician: Toma Deiters, MD [address] [place] [address]  Referring MD: PCP  Chief Complaint: Loose bowel movements  History of Present Illness: Suzanne Davis is a 83 y.o. female with past medical history of COPD, IBS, OSA, melanoma, anxiety, who presents for evaluation of loose bowel movements.  Patient reports that she for the last 1-2 years, she has presented episodes of diarrhea. She reports fluctuation in her bowel movements. She usually has 2-3 episodes of formed fluffy bowel movements. She states that sometimes it can be up to 4 bowel movements.  No nighttime symptoms. No fecal soiling.  Notably, the patient was seen byDr. Hillery Hunter at [facility] clinic in 03/28/2023 for evaluation of dysphagia and loose stools  Patient was scheduled to undergo flexible sigmoidoscopy and EGD.  Was advised to start taking cholestyramine for management of chronic diarrhea.  She also used to follow with Dr. Norwood Levo at [facility], who initially prescribed colestipol and checked a hydrogen breath test for SIBO testing (unclear if this was ever completed).  Patient states that she has tried Imodium in the past occasionally. Has tried colestipol in the past but made her diarrhea worse as she had 5-6 bowel movements per day, not taking any at the moment. Does not take any fiber supplement.  States last Tuesday night she had acute onset of nausea, vomiting and watery diarrhea up to 7 bowel movements. This stopped on its own and his bowel movements have normalized.  One time she had fresh blood in her stool, around Christmas time.  The patient denies having any nausea, vomiting, fever, chills, melena, hematemesis, abdominal distention, abdominal pain, jaundice, pruritus or weight loss.  Last EGD: 08/16/2013, performed by Dr. Teena Dunk mild  gastritis in antrum, Schatzki's ring was dilated up to 18 mm with savory dilator Last Colonoscopy: 08/16/2013, performed by Dr. Teena Dunk Presence of 2 polyps and diverticulosis  FHx: neg for any gastrointestinal/liver disease, no malignancies Social: neg smoking, alcohol or illicit drug use Surgical: cholecystectomy in 1980s, "tumor resection from the liver"  Past Medical History: Past Medical History:  Diagnosis Date   Abdominal pain 11/25/2018   Anxiety    COPD (chronic obstructive pulmonary disease) ([facility]) 09/2019   Gallstones    Hyperlipidemia    Hypertension    IBS (irritable bowel syndrome) 1998   Melanoma ([facility]) 2017   right arm   Sciatica    Sleep apnea 2022   Supraventricular tachycardia, paroxysmal ([facility]) 1989    Past Surgical History: Past Surgical History:  Procedure Laterality Date   CHOLECYSTECTOMY  1990   at same time removed benign liver tumor   colonoscopy 2017     right shoulder spurs surgery  2000    Family History: Family History  Problem Relation Age of Onset   Diabetes Mother    Heart disease Mother    Prostate cancer Father    Colon cancer Neg Hx    Esophageal cancer Neg Hx    Rectal cancer Neg Hx     Social History: Social History   Tobacco Use  Smoking Status Former   Types: Cigarettes  Smokeless Tobacco Never  Tobacco Comments   Quit in 01/1980   Social History   Substance and Sexual Activity  Alcohol Use Never   Social History   Substance and Sexual Activity  Drug Use Never    Allergies: Allergies  Allergen  Reactions   Ethambutol Other (See Comments)    High Fever--confirmed with sequential rechallenge   Tetracycline Hives   Doxycycline Other (See Comments)    Mouth soreness     Medications: Current Outpatient Medications  Medication Sig Dispense Refill   Ascorbic Acid (VITAMIN C WITH ROSE HIPS) 500 MG tablet Take 500 mg by mouth daily.     aspirin 81 MG chewable tablet Chew by mouth daily.     atorvastatin (LIPITOR)  10 MG tablet Take 10 mg by mouth daily.     Biotin 16109 MCG TABS Take by mouth daily.     Cholecalciferol (VITAMIN D3) 10 MCG (400 UNIT) CAPS Take 2,000 Units by mouth daily.     Coenzyme Q10 (COQ10) 200 MG CAPS Take by mouth daily at 6 (six) AM.     Cyanocobalamin (VITAMIN B12) 3000 MCG SUBL Place under the tongue. Takes one on even days     guaiFENesin-codeine 100-10 MG/5ML syrup Take 5 mLs by mouth every 6 (six) hours as needed for cough. 120 mL 0   metoprolol succinate (TOPROL-XL) 25 MG 24 hr tablet Take 25 mg by mouth daily.     Multiple Vitamin (MULTIVITAMIN) tablet Take 1 tablet by mouth daily.     nitroGLYCERIN (NITROSTAT) 0.4 MG SL tablet 0.4 mg every 5 (five) minutes as needed.     Omega-3 Fatty Acids (FISH OIL) 1000 MG CPDR Take by mouth 2 (two) times daily.     OVER THE COUNTER MEDICATION Saline nebulizer daily.     cholestyramine (QUESTRAN) 4 g packet Take 0.5 packets (2 g total) by mouth 2 (two) times daily. (Patient not taking: Reported on 03/01/2024) 60 each 12   No current facility-administered medications for this visit.    Review of Systems: GENERAL: negative for malaise, night sweats HEENT: No changes in hearing or vision, no nose bleeds or other nasal problems. NECK: Negative for lumps, goiter, pain and significant neck swelling RESPIRATORY: Negative for cough, wheezing CARDIOVASCULAR: Negative for chest pain, leg swelling, palpitations, orthopnea GI: SEE HPI MUSCULOSKELETAL: Negative for joint pain or swelling, back pain, and muscle pain. SKIN: Negative for lesions, rash PSYCH: Negative for sleep disturbance, mood disorder and recent psychosocial stressors. HEMATOLOGY Negative for prolonged bleeding, bruising easily, and swollen nodes. ENDOCRINE: Negative for cold or heat intolerance, polyuria, polydipsia and goiter. NEURO: negative for tremor, gait imbalance, syncope and seizures. The remainder of the review of systems is noncontributory.   Physical Exam: BP (!)  145/66 (BP Location: Left Arm, Patient Position: Sitting, Cuff Size: Normal)   Pulse 60   Temp (!) 97.2 F (36.2 C) (Temporal)   Ht 5\' 8"  (1.727 m)   Wt 132 lb 9.6 oz (60.1 kg)   BMI 20.16 kg/m  GENERAL: The patient is AO x3, in no acute distress. HEENT: Head is normocephalic and atraumatic. EOMI are intact. Mouth is well hydrated and without lesions. NECK: Supple. No masses LUNGS: Clear to auscultation. No presence of rhonchi/wheezing/rales. Adequate chest expansion HEART: RRR, normal s1 and s2. ABDOMEN: Soft, nontender, no guarding, no peritoneal signs, and nondistended. BS +. No masses. EXTREMITIES: Without any cyanosis, clubbing, rash, lesions or edema. NEUROLOGIC: AOx3, no focal motor deficit. SKIN: no jaundice, no rashes   Imaging/Labs: as above  I personally reviewed and interpreted the available labs, imaging and endoscopic files.  Impression and Plan: Suzanne Davis is a 83 y.o. female with past medical history of COPD, IBS, OSA, melanoma, anxiety, who presents for evaluation of loose bowel movements.  Patient has presented history of loose bowel movements which have slightly increased in frequency, although more recently they have been close to her baseline.  She denies having any other associated symptoms or red flag signs.  We discussed that it is possible she may have had some mild degree of IBS.  She has not improved with the use of bowel salt binders.  I will recommend starting fiber supplementation for now.  We discussed the possibility of proceeding with a colonoscopy given her isolated episode of rectal bleeding but she would like to hold off on this for now.  -Start Benefiber fiber supplements daily to increase stool bulk -If persisting symptoms on follow up, will need to proceed with fecal testing to rule out EPI, TSH and colonoscopy  All questions were answered.      Katrinka Blazing, MD Gastroenterology and Hepatology [facility]

## 2024-04-05 DIAGNOSIS — I10 Essential (primary) hypertension: Secondary | ICD-10-CM | POA: Diagnosis not present

## 2024-04-05 DIAGNOSIS — J305 Allergic rhinitis due to food: Secondary | ICD-10-CM | POA: Diagnosis not present

## 2024-04-05 DIAGNOSIS — N182 Chronic kidney disease, stage 2 (mild): Secondary | ICD-10-CM | POA: Diagnosis not present

## 2024-04-05 DIAGNOSIS — M5430 Sciatica, unspecified side: Secondary | ICD-10-CM | POA: Diagnosis not present

## 2024-04-05 DIAGNOSIS — K219 Gastro-esophageal reflux disease without esophagitis: Secondary | ICD-10-CM | POA: Diagnosis not present

## 2024-04-05 DIAGNOSIS — E7849 Other hyperlipidemia: Secondary | ICD-10-CM | POA: Diagnosis not present

## 2024-04-05 DIAGNOSIS — F411 Generalized anxiety disorder: Secondary | ICD-10-CM | POA: Diagnosis not present

## 2024-04-05 DIAGNOSIS — I700 Atherosclerosis of aorta: Secondary | ICD-10-CM | POA: Diagnosis not present

## 2024-04-13 DIAGNOSIS — L570 Actinic keratosis: Secondary | ICD-10-CM | POA: Diagnosis not present

## 2024-04-13 DIAGNOSIS — Z85820 Personal history of malignant melanoma of skin: Secondary | ICD-10-CM | POA: Diagnosis not present

## 2024-04-13 DIAGNOSIS — L218 Other seborrheic dermatitis: Secondary | ICD-10-CM | POA: Diagnosis not present

## 2024-04-13 DIAGNOSIS — Z85828 Personal history of other malignant neoplasm of skin: Secondary | ICD-10-CM | POA: Diagnosis not present

## 2024-04-30 DIAGNOSIS — Z1629 Resistance to other single specified antibiotic: Secondary | ICD-10-CM | POA: Diagnosis not present

## 2024-04-30 DIAGNOSIS — Z79899 Other long term (current) drug therapy: Secondary | ICD-10-CM | POA: Diagnosis not present

## 2024-04-30 DIAGNOSIS — E785 Hyperlipidemia, unspecified: Secondary | ICD-10-CM | POA: Diagnosis not present

## 2024-04-30 DIAGNOSIS — M5430 Sciatica, unspecified side: Secondary | ICD-10-CM | POA: Diagnosis not present

## 2024-04-30 DIAGNOSIS — J471 Bronchiectasis with (acute) exacerbation: Secondary | ICD-10-CM | POA: Diagnosis not present

## 2024-04-30 DIAGNOSIS — R002 Palpitations: Secondary | ICD-10-CM | POA: Diagnosis not present

## 2024-04-30 DIAGNOSIS — J479 Bronchiectasis, uncomplicated: Secondary | ICD-10-CM | POA: Diagnosis not present

## 2024-04-30 DIAGNOSIS — Z87891 Personal history of nicotine dependence: Secondary | ICD-10-CM | POA: Diagnosis not present

## 2024-04-30 DIAGNOSIS — I10 Essential (primary) hypertension: Secondary | ICD-10-CM | POA: Diagnosis not present

## 2024-04-30 DIAGNOSIS — Z7982 Long term (current) use of aspirin: Secondary | ICD-10-CM | POA: Diagnosis not present

## 2024-05-07 DIAGNOSIS — I700 Atherosclerosis of aorta: Secondary | ICD-10-CM | POA: Diagnosis not present

## 2024-05-07 DIAGNOSIS — I471 Supraventricular tachycardia, unspecified: Secondary | ICD-10-CM | POA: Diagnosis not present

## 2024-05-07 DIAGNOSIS — I872 Venous insufficiency (chronic) (peripheral): Secondary | ICD-10-CM | POA: Diagnosis not present

## 2024-05-07 DIAGNOSIS — I10 Essential (primary) hypertension: Secondary | ICD-10-CM | POA: Diagnosis not present

## 2024-05-07 DIAGNOSIS — N189 Chronic kidney disease, unspecified: Secondary | ICD-10-CM | POA: Diagnosis not present

## 2024-05-07 DIAGNOSIS — K219 Gastro-esophageal reflux disease without esophagitis: Secondary | ICD-10-CM | POA: Diagnosis not present

## 2024-05-07 DIAGNOSIS — I272 Pulmonary hypertension, unspecified: Secondary | ICD-10-CM | POA: Diagnosis not present

## 2024-05-07 DIAGNOSIS — E782 Mixed hyperlipidemia: Secondary | ICD-10-CM | POA: Diagnosis not present

## 2024-05-14 ENCOUNTER — Other Ambulatory Visit: Payer: Self-pay | Admitting: Internal Medicine

## 2024-05-14 DIAGNOSIS — M545 Low back pain, unspecified: Secondary | ICD-10-CM

## 2024-05-18 ENCOUNTER — Encounter (INDEPENDENT_AMBULATORY_CARE_PROVIDER_SITE_OTHER): Payer: Self-pay | Admitting: Gastroenterology

## 2024-05-21 NOTE — Discharge Instructions (Signed)

## 2024-05-25 ENCOUNTER — Ambulatory Visit
Admission: RE | Admit: 2024-05-25 | Discharge: 2024-05-25 | Disposition: A | Discharge status: Home / Self Care | Source: Ambulatory Visit | Attending: Internal Medicine | Admitting: Internal Medicine

## 2024-05-25 DIAGNOSIS — M545 Low back pain, unspecified: Secondary | ICD-10-CM

## 2024-05-25 DIAGNOSIS — M48061 Spinal stenosis, lumbar region without neurogenic claudication: Secondary | ICD-10-CM | POA: Diagnosis not present

## 2024-05-25 DIAGNOSIS — M4727 Other spondylosis with radiculopathy, lumbosacral region: Secondary | ICD-10-CM | POA: Diagnosis not present

## 2024-05-25 MED ORDER — IOPAMIDOL (ISOVUE-M 200) INJECTION 41%
1.0000 mL | Freq: Once | INTRAMUSCULAR | Status: AC
Start: 2024-05-25 — End: 2024-05-25
  Administered 2024-05-25: 1 mL via EPIDURAL

## 2024-05-25 MED ORDER — METHYLPREDNISOLONE ACETATE 40 MG/ML INJ SUSP (RADIOLOG
80.0000 mg | Freq: Once | INTRAMUSCULAR | Status: AC
  Administered 2024-05-25: 80 mg via EPIDURAL

## 2024-06-15 DIAGNOSIS — H5203 Hypermetropia, bilateral: Secondary | ICD-10-CM | POA: Diagnosis not present

## 2024-06-15 DIAGNOSIS — H04123 Dry eye syndrome of bilateral lacrimal glands: Secondary | ICD-10-CM | POA: Diagnosis not present

## 2024-06-15 DIAGNOSIS — H52223 Regular astigmatism, bilateral: Secondary | ICD-10-CM | POA: Diagnosis not present

## 2024-06-15 DIAGNOSIS — H524 Presbyopia: Secondary | ICD-10-CM | POA: Diagnosis not present

## 2024-06-16 DIAGNOSIS — I272 Pulmonary hypertension, unspecified: Secondary | ICD-10-CM | POA: Diagnosis not present

## 2024-07-05 ENCOUNTER — Encounter (INDEPENDENT_AMBULATORY_CARE_PROVIDER_SITE_OTHER): Payer: Self-pay | Admitting: Gastroenterology

## 2024-07-05 ENCOUNTER — Ambulatory Visit (INDEPENDENT_AMBULATORY_CARE_PROVIDER_SITE_OTHER): Admitting: Gastroenterology

## 2024-07-05 VITALS — BP 176/69 | HR 74 | Temp 97.1°F | Ht 68.0 in | Wt 134.6 lb

## 2024-07-05 DIAGNOSIS — R194 Change in bowel habit: Secondary | ICD-10-CM | POA: Insufficient documentation

## 2024-07-05 DIAGNOSIS — R195 Other fecal abnormalities: Secondary | ICD-10-CM | POA: Diagnosis not present

## 2024-07-05 MED ORDER — COLESTIPOL HCL 1 G PO TABS: 1.0000 g | ORAL_TABLET | Freq: Every day | ORAL | 1 refills | Status: DC

## 2024-07-05 MED ORDER — COLESTIPOL HCL 1 G PO TABS: 1.0000 g | ORAL_TABLET | Freq: Every day | ORAL | 1 refills | Status: AC

## 2024-07-05 NOTE — Patient Instructions (Signed)
 Start colestipol  1 g qday - take 4 hours apart from other medications Perform blood and stool workup

## 2024-07-05 NOTE — Progress Notes (Unsigned)
 [name], M.D. Gastroenterology & Hepatology [facility] [address] [place], [place] [address]  Primary Care Physician: Orpha Yancey LABOR, MD [address] [place] [address]  I will communicate my assessment and recommendations to the referring MD via EMR.  Problems: Change in bowel habits  History of Present Illness: Thomasene Wise is a 83 y.o. female with past medical history of COPD, IBS, OSA, melanoma, anxiety, who presents for evaluation of loose bowel movements.   The patient was last seen on 03/01/2024. At that time, the patient was started on Benefiber supplements.  Patient reports that she is having 1-3 Bms soft and formed Bms per day, usually when she has a meal. No nighttime symptoms. Sometimes has some pain in the right side of the abdomen. She reports a longstanding history of diarrhea episodes intermittently though her life. Also states BMs occurs when she is stressed.  The patient denies having any nausea, vomiting, fever, chills, hematochezia, melena, hematemesis, abdominal distention, abdominal pain,  jaundice, pruritus or weight loss.  Patient took Questran  in the past but had paradoxical diarrhea.  Last EGD: 08/16/2013, performed by Dr. Donnel mild gastritis in antrum, Schatzki's ring was dilated up to 18 mm with savory dilator Last Colonoscopy: 08/16/2013, performed by Dr. Donnel Presence of 2 polyps and diverticulosis  Past Medical History: Past Medical History:  Diagnosis Date   Abdominal pain 11/25/2018   Anxiety    COPD (chronic obstructive pulmonary disease) (HCC) 09/2019   Gallstones    Hyperlipidemia    Hypertension    IBS (irritable bowel syndrome) 1998   Melanoma (HCC) 2017   right arm   Sciatica    Sleep apnea 2022   Supraventricular tachycardia, paroxysmal (HCC) 1989    Past Surgical History: Past Surgical History:  Procedure Laterality Date   CHOLECYSTECTOMY  1990   at same time  removed benign liver tumor   colonoscopy 2017     right shoulder spurs surgery  2000    Family History: Family History  Problem Relation Age of Onset   Diabetes Mother    Heart disease Mother    Prostate cancer Father    Colon cancer Neg Hx    Esophageal cancer Neg Hx    Rectal cancer Neg Hx     Social History: Social History   Tobacco Use  Smoking Status Former   Types: Cigarettes  Smokeless Tobacco Never  Tobacco Comments   Quit in 01/1980   Social History   Substance and Sexual Activity  Alcohol Use Never   Social History   Substance and Sexual Activity  Drug Use Never    Allergies: Allergies  Allergen Reactions   Ethambutol Other (See Comments)    High Fever--confirmed with sequential rechallenge   Tetracycline Hives   Doxycycline Other (See Comments)    Mouth soreness     Medications: Current Outpatient Medications  Medication Sig Dispense Refill   Ascorbic Acid (VITAMIN C WITH ROSE HIPS) 500 MG tablet Take 500 mg by mouth daily.     aspirin 81 MG chewable tablet Chew by mouth daily.     atorvastatin (LIPITOR) 10 MG tablet Take 10 mg by mouth daily.     Biotin 89999 MCG TABS Take by mouth daily.     Cholecalciferol (VITAMIN D3) 10 MCG (400 UNIT) CAPS Take 2,000 Units by mouth daily.     Coenzyme Q10 (COQ10) 200 MG CAPS Take by mouth daily at 6 (six) AM.  Cyanocobalamin (VITAMIN B12) 3000 MCG SUBL Place under the tongue. Takes one on even days     metoprolol succinate (TOPROL-XL) 25 MG 24 hr tablet Take 25 mg by mouth daily.     Multiple Vitamin (MULTIVITAMIN) tablet Take 1 tablet by mouth daily.     nitroGLYCERIN (NITROSTAT) 0.4 MG SL tablet 0.4 mg every 5 (five) minutes as needed.     Omega-3 Fatty Acids (FISH OIL) 1000 MG CPDR Take by mouth 2 (two) times daily.     OVER THE COUNTER MEDICATION Saline nebulizer daily.     cholestyramine  (QUESTRAN ) 4 g packet Take 0.5 packets (2 g total) by mouth 2 (two) times daily. (Patient not taking: Reported on  07/05/2024) 60 each 12   No current facility-administered medications for this visit.    Review of Systems: GENERAL: negative for malaise, night sweats HEENT: No changes in hearing or vision, no nose bleeds or other nasal problems. NECK: Negative for lumps, goiter, pain and significant neck swelling RESPIRATORY: Negative for cough, wheezing CARDIOVASCULAR: Negative for chest pain, leg swelling, palpitations, orthopnea GI: SEE HPI MUSCULOSKELETAL: Negative for joint pain or swelling, back pain, and muscle pain. SKIN: Negative for lesions, rash PSYCH: Negative for sleep disturbance, mood disorder and recent psychosocial stressors. HEMATOLOGY Negative for prolonged bleeding, bruising easily, and swollen nodes. ENDOCRINE: Negative for cold or heat intolerance, polyuria, polydipsia and goiter. NEURO: negative for tremor, gait imbalance, syncope and seizures. The remainder of the review of systems is noncontributory.   Physical Exam: BP (!) 176/69 (BP Location: Left Arm, Patient Position: Sitting, Cuff Size: Large)   Pulse 74   Temp (!) 97.1 F (36.2 C) (Temporal)   Ht 5' 8 (1.727 m)   Wt 134 lb 9.6 oz (61.1 kg)   BMI 20.47 kg/m  GENERAL: The patient is AO x3, in no acute distress. HEENT: Head is normocephalic and atraumatic. EOMI are intact. Mouth is well hydrated and without lesions. NECK: Supple. No masses LUNGS: Clear to auscultation. No presence of rhonchi/wheezing/rales. Adequate chest expansion HEART: RRR, normal s1 and s2. ABDOMEN: Soft, nontender, no guarding, no peritoneal signs, and nondistended. BS +. No masses. RECTAL EXAM: no external lesions, normal tone, no masses, brown stool without blood.*** Chaperone: EXTREMITIES: Without any cyanosis, clubbing, rash, lesions or edema. NEUROLOGIC: AOx3, no focal motor deficit. SKIN: no jaundice, no rashes  Imaging/Labs: as above  I personally reviewed and interpreted the available labs, imaging and endoscopic  files.  Impression and Plan: Tosca Cannady is a 83 y.o. female coming for follow up of ***  Given chronicity of symptoms, it is very possible this could be related to chronic IBS-D that has fluctuated in severity through the years.  - If negative workup, will need to proceed with colonoscopy.  All questions were answered.      [name], MD Gastroenterology and Hepatology [facility] Gastroenterology

## 2024-07-06 LAB — CELIAC DISEASE PANEL
(tTG) Ab, IgA: <1 U/mL
(tTG) Ab, IgG: <1 U/mL
Gliadin IgA: <1 U/mL
Gliadin IgG: <1 U/mL
Immunoglobulin A: 194 mg/dL (ref 70–320)

## 2024-07-06 LAB — TSH: TSH: 0.25 m[IU]/L — ABNORMAL LOW (ref 0.40–4.50)

## 2024-07-08 DIAGNOSIS — K219 Gastro-esophageal reflux disease without esophagitis: Secondary | ICD-10-CM | POA: Diagnosis not present

## 2024-07-08 DIAGNOSIS — Z0000 Encounter for general adult medical examination without abnormal findings: Secondary | ICD-10-CM | POA: Diagnosis not present

## 2024-07-08 DIAGNOSIS — M5430 Sciatica, unspecified side: Secondary | ICD-10-CM | POA: Diagnosis not present

## 2024-07-08 DIAGNOSIS — Z1331 Encounter for screening for depression: Secondary | ICD-10-CM | POA: Diagnosis not present

## 2024-07-08 DIAGNOSIS — I700 Atherosclerosis of aorta: Secondary | ICD-10-CM | POA: Diagnosis not present

## 2024-07-08 DIAGNOSIS — E7849 Other hyperlipidemia: Secondary | ICD-10-CM | POA: Diagnosis not present

## 2024-07-08 DIAGNOSIS — J305 Allergic rhinitis due to food: Secondary | ICD-10-CM | POA: Diagnosis not present

## 2024-07-08 DIAGNOSIS — I10 Essential (primary) hypertension: Secondary | ICD-10-CM | POA: Diagnosis not present

## 2024-07-08 DIAGNOSIS — N182 Chronic kidney disease, stage 2 (mild): Secondary | ICD-10-CM | POA: Diagnosis not present

## 2024-07-08 DIAGNOSIS — F411 Generalized anxiety disorder: Secondary | ICD-10-CM | POA: Diagnosis not present

## 2024-07-09 DIAGNOSIS — I272 Pulmonary hypertension, unspecified: Secondary | ICD-10-CM | POA: Diagnosis not present

## 2024-07-09 DIAGNOSIS — I872 Venous insufficiency (chronic) (peripheral): Secondary | ICD-10-CM | POA: Diagnosis not present

## 2024-07-09 DIAGNOSIS — I471 Supraventricular tachycardia, unspecified: Secondary | ICD-10-CM | POA: Diagnosis not present

## 2024-07-09 DIAGNOSIS — E782 Mixed hyperlipidemia: Secondary | ICD-10-CM | POA: Diagnosis not present

## 2024-07-09 DIAGNOSIS — N189 Chronic kidney disease, unspecified: Secondary | ICD-10-CM | POA: Diagnosis not present

## 2024-07-09 DIAGNOSIS — I700 Atherosclerosis of aorta: Secondary | ICD-10-CM | POA: Diagnosis not present

## 2024-07-09 DIAGNOSIS — I10 Essential (primary) hypertension: Secondary | ICD-10-CM | POA: Diagnosis not present

## 2024-07-09 DIAGNOSIS — K219 Gastro-esophageal reflux disease without esophagitis: Secondary | ICD-10-CM | POA: Diagnosis not present

## 2024-07-13 ENCOUNTER — Ambulatory Visit (INDEPENDENT_AMBULATORY_CARE_PROVIDER_SITE_OTHER): Payer: Self-pay | Admitting: Gastroenterology

## 2024-07-23 DIAGNOSIS — R194 Change in bowel habit: Secondary | ICD-10-CM | POA: Diagnosis not present

## 2024-07-23 DIAGNOSIS — R195 Other fecal abnormalities: Secondary | ICD-10-CM | POA: Diagnosis not present

## 2024-07-28 DIAGNOSIS — M79675 Pain in left toe(s): Secondary | ICD-10-CM | POA: Diagnosis not present

## 2024-07-28 DIAGNOSIS — M7742 Metatarsalgia, left foot: Secondary | ICD-10-CM | POA: Diagnosis not present

## 2024-07-28 DIAGNOSIS — L602 Onychogryphosis: Secondary | ICD-10-CM | POA: Diagnosis not present

## 2024-07-29 LAB — CALPROTECTIN: Calprotectin: 11 ug/g

## 2024-07-29 LAB — FECAL FAT, QUALITATIVE: FECAL FAT, QUALITATIVE: ABNORMAL — AB

## 2024-07-29 LAB — PANCREATIC ELASTASE, FECAL: Pancreatic Elastase-1, Stool: 770 ug/g (ref >200)

## 2024-08-05 ENCOUNTER — Telehealth (INDEPENDENT_AMBULATORY_CARE_PROVIDER_SITE_OTHER): Payer: Self-pay

## 2024-08-05 NOTE — Telephone Encounter (Signed)
 Patient calling today stating when she talked with you the other day you have mentioned Enzymes. I looked at the stool result note and did not see any mention of enzymes or in which context the of Enzymes she is referring. Whether liver enzymes or taking oral plant enzymes. Please advise.

## 2024-08-05 NOTE — Telephone Encounter (Signed)
 I had asked if she was interested in taking pancreatic enzymes for now as she had fat in stool but she mention she would rather proceed with colonoscopy.  We will proceed with colonoscopy for now and depending on findings we will consider restarting the pancreatic enzymes.

## 2024-08-06 ENCOUNTER — Other Ambulatory Visit: Payer: Self-pay | Admitting: *Deleted

## 2024-08-06 ENCOUNTER — Encounter: Payer: Self-pay | Admitting: *Deleted

## 2024-08-06 MED ORDER — PEG 3350-KCL-NA BICARB-NACL 420 G PO SOLR: 4000.0000 mL | Freq: Once | ORAL | 0 refills | Status: AC

## 2024-08-06 NOTE — Telephone Encounter (Signed)
 Pt has been scheduled for 09/03/24. Instructions mailed and prep sent to pharmacy. Pt was offered procedure time of 7:30 am on that date and she states they live an hour and half away, needed something later.

## 2024-08-06 NOTE — Telephone Encounter (Signed)
 I called and left a vm asked that patient please return call.

## 2024-08-09 NOTE — Telephone Encounter (Addendum)
 I spoke with the patient and made her aware per Dr. Eartha, Per Dr. Eartha,   I had asked if she was interested in taking pancreatic enzymes for now as she had fat in stool but she mention she would rather proceed with colonoscopy.  We will proceed with colonoscopy for now and depending on findings we will consider restarting the pancreatic enzymes.  Patient states understanding.   I called and left a vm asked that the patient please return call to the office.

## 2024-08-10 NOTE — Telephone Encounter (Signed)
 08/10/2024 @ 2:45 pm I spoke with the patient and made her aware per Dr. Eartha, Per Dr. Eartha,   I had asked if she was interested in taking pancreatic enzymes for now as she had fat in stool but she mention she would rather proceed with colonoscopy.  We will proceed with colonoscopy for now and depending on findings we will consider restarting the pancreatic enzymes.  Patient states understanding.    I called and left a vm asked that the patient please return call to the office.

## 2024-08-11 NOTE — Telephone Encounter (Signed)
 Pt left voicemail stating that she would like to speak with the lady that she spoke with the other day. She would like to talk to her about getting the enzymes now instead of waiting.  Returned call to pt but had to leave voicemail.

## 2024-08-11 NOTE — Telephone Encounter (Signed)
 Pt left voicemail returning call. Returned call to pt. Pt states she would like to go ahead and try enzymes because it is 3 weeks until TCS. Pt states the enzymes may help. Advised pt that provider is out of the office and when he returns, he will get the message and we will go from there. Pt verbalized understanding.

## 2024-08-13 DIAGNOSIS — Z1329 Encounter for screening for other suspected endocrine disorder: Secondary | ICD-10-CM | POA: Diagnosis not present

## 2024-08-13 DIAGNOSIS — R5383 Other fatigue: Secondary | ICD-10-CM | POA: Diagnosis not present

## 2024-08-13 DIAGNOSIS — R946 Abnormal results of thyroid function studies: Secondary | ICD-10-CM | POA: Diagnosis not present

## 2024-08-16 ENCOUNTER — Other Ambulatory Visit: Payer: Self-pay | Admitting: Gastroenterology

## 2024-08-16 DIAGNOSIS — K8681 Exocrine pancreatic insufficiency: Secondary | ICD-10-CM

## 2024-08-16 MED ORDER — PANCRELIPASE (LIP-PROT-AMYL) 36000-114000 UNITS PO CPEP: ORAL_CAPSULE | ORAL | 11 refills | Status: AC

## 2024-08-16 NOTE — Telephone Encounter (Signed)
 I sent Creon  to her pharmacy, needs to take 2 with meals and 1 with snacks. Needs to take first pill when she start eating her meal and second pill when she is halfway through her meal. Thanks

## 2024-08-17 NOTE — Telephone Encounter (Signed)
 Pt left message returning call. Returned call to pt and went over instructions multiple times. Pt states that the pharmacy states it will be $1305. Pt wanted to know how many was sent in and I advised her #300 with 11 refills. Concurred with [facility] because pt wanted to know what was wrong with pancreas;  advised pt that her pancreas was not making enough enzymes.

## 2024-08-17 NOTE — Telephone Encounter (Signed)
 Left message to return call

## 2024-08-27 DIAGNOSIS — M5430 Sciatica, unspecified side: Secondary | ICD-10-CM | POA: Diagnosis not present

## 2024-08-27 DIAGNOSIS — J471 Bronchiectasis with (acute) exacerbation: Secondary | ICD-10-CM | POA: Diagnosis not present

## 2024-08-27 DIAGNOSIS — Z72820 Sleep deprivation: Secondary | ICD-10-CM | POA: Diagnosis not present

## 2024-08-27 DIAGNOSIS — J3489 Other specified disorders of nose and nasal sinuses: Secondary | ICD-10-CM | POA: Diagnosis not present

## 2024-08-27 DIAGNOSIS — J479 Bronchiectasis, uncomplicated: Secondary | ICD-10-CM | POA: Diagnosis not present

## 2024-09-03 ENCOUNTER — Encounter (HOSPITAL_COMMUNITY): Payer: Self-pay | Admitting: Gastroenterology

## 2024-09-03 ENCOUNTER — Ambulatory Visit (HOSPITAL_COMMUNITY)
Admission: RE | Admit: 2024-09-03 | Discharge: 2024-09-03 | Disposition: A | Discharge status: Home / Self Care | Attending: Gastroenterology | Admitting: Gastroenterology

## 2024-09-03 ENCOUNTER — Ambulatory Visit (HOSPITAL_BASED_OUTPATIENT_CLINIC_OR_DEPARTMENT_OTHER): Admitting: Anesthesiology

## 2024-09-03 ENCOUNTER — Encounter (HOSPITAL_COMMUNITY)
Admission: RE | Disposition: A | Discharge status: Home / Self Care | Payer: Self-pay | Source: Home / Self Care | Attending: Gastroenterology

## 2024-09-03 ENCOUNTER — Other Ambulatory Visit: Payer: Self-pay

## 2024-09-03 ENCOUNTER — Ambulatory Visit (HOSPITAL_COMMUNITY): Admitting: Anesthesiology

## 2024-09-03 DIAGNOSIS — D120 Benign neoplasm of cecum: Secondary | ICD-10-CM | POA: Insufficient documentation

## 2024-09-03 DIAGNOSIS — Z87891 Personal history of nicotine dependence: Secondary | ICD-10-CM | POA: Diagnosis not present

## 2024-09-03 DIAGNOSIS — G4730 Sleep apnea, unspecified: Secondary | ICD-10-CM | POA: Diagnosis not present

## 2024-09-03 DIAGNOSIS — I10 Essential (primary) hypertension: Secondary | ICD-10-CM | POA: Insufficient documentation

## 2024-09-03 DIAGNOSIS — K5730 Diverticulosis of large intestine without perforation or abscess without bleeding: Secondary | ICD-10-CM | POA: Diagnosis not present

## 2024-09-03 DIAGNOSIS — K648 Other hemorrhoids: Secondary | ICD-10-CM | POA: Diagnosis not present

## 2024-09-03 DIAGNOSIS — G4733 Obstructive sleep apnea (adult) (pediatric): Secondary | ICD-10-CM | POA: Diagnosis not present

## 2024-09-03 DIAGNOSIS — R194 Change in bowel habit: Secondary | ICD-10-CM | POA: Diagnosis not present

## 2024-09-03 DIAGNOSIS — D125 Benign neoplasm of sigmoid colon: Secondary | ICD-10-CM | POA: Diagnosis not present

## 2024-09-03 DIAGNOSIS — D123 Benign neoplasm of transverse colon: Secondary | ICD-10-CM | POA: Insufficient documentation

## 2024-09-03 DIAGNOSIS — K589 Irritable bowel syndrome without diarrhea: Secondary | ICD-10-CM | POA: Diagnosis not present

## 2024-09-03 DIAGNOSIS — J449 Chronic obstructive pulmonary disease, unspecified: Secondary | ICD-10-CM | POA: Insufficient documentation

## 2024-09-03 DIAGNOSIS — K635 Polyp of colon: Secondary | ICD-10-CM | POA: Diagnosis not present

## 2024-09-03 SURGERY — COLONOSCOPY
Anesthesia: General

## 2024-09-03 MED ORDER — LACTATED RINGERS IV SOLN: INTRAVENOUS | Status: DC

## 2024-09-03 MED ORDER — PROPOFOL 10 MG/ML IV BOLUS
INTRAVENOUS | Status: DC | PRN
Start: 2024-09-03 — End: 2024-09-03
  Administered 2024-09-03 (×3): 20 mg via INTRAVENOUS
  Administered 2024-09-03: 70 mg via INTRAVENOUS
  Administered 2024-09-03 (×4): 20 mg via INTRAVENOUS

## 2024-09-03 NOTE — Op Note (Signed)
 [facility] Patient Name: Suzanne Davis Procedure Date: 09/03/2024 9:45 AM MRN: [id number] Date of Birth: 06-29-1941 Attending MD: [name] , , [id number] CSN: [id number] Age: 83 Admit Type: Outpatient Procedure:                Colonoscopy Indications:              Change in bowel habits Providers:                [name], Rosina Sprague, Devere Lodge Referring MD:              Medicines:                Monitored Anesthesia Care Complications:            No immediate complications. Estimated Blood Loss:     Estimated blood loss: none. Procedure:                Pre-Anesthesia Assessment:                           - Prior to the procedure, a History and Physical                            was performed, and patient medications, allergies                            and sensitivities were reviewed. The patient's                            tolerance of previous anesthesia was reviewed.                           - The risks and benefits of the procedure and the                            sedation options and risks were discussed with the                            patient. All questions were answered and informed                            consent was obtained.                           - ASA Grade Assessment: II - A patient with mild                            systemic disease.                           After obtaining informed consent, the colonoscope                            was passed under direct vision. Throughout the                            procedure, the patient's blood pressure, pulse,  and                            oxygen  saturations were monitored continuously. The                            [id number]-[id number] ([id number]) Peds Colon was introduced                            through the anus and advanced to the the cecum,                            identified by appendiceal orifice and ileocecal                            valve. The colonoscopy was performed without                             difficulty. The patient tolerated the procedure                            well. The quality of the bowel preparation was                            excellent. Scope In: 10:21:45 AM Scope Out: 10:51:11 AM Scope Withdrawal Time: 0 hours 23 minutes 20 seconds  Total Procedure Duration: 0 hours 29 minutes 26 seconds  Findings:      The perianal and digital rectal examinations were normal.      Three sessile polyps were found in the transverse colon and cecum. The       polyps were 3 to 5 mm in size. These polyps were removed with a cold       snare. Resection and retrieval were complete.      A 4 mm polyp was found in the sigmoid colon. The polyp was sessile. The       polyp was removed with a cold snare. Resection and retrieval were       complete.      A few small-mouthed diverticula were found in the sigmoid colon.       Biopsies for histology were taken with a cold forceps from the normal       right colon and left colon for evaluation of microscopic colitis.      Non-bleeding internal hemorrhoids were found during retroflexion. The       hemorrhoids were small. Impression:               - Three 3 to 5 mm polyps in the transverse colon                            and in the cecum, removed with a cold snare.                            Resected and retrieved.                           - One 4 mm polyp in the sigmoid colon,  removed with                            a cold snare. Resected and retrieved.                           - Diverticulosis in the sigmoid colon. Normal colon                            biopsied.                           - Non-bleeding internal hemorrhoids. Moderate Sedation:      Per Anesthesia Care Recommendation:           - Discharge patient to home (ambulatory).                           - Resume previous diet.                           - Await pathology results.                           - Repeat colonoscopy for surveillance based on                             pathology results. Procedure Code(s):        --- Professional ---                           [phone], Colonoscopy, flexible; with removal of                            tumor(s), polyp(s), or other lesion(s) by snare                            technique                           45380, 59, Colonoscopy, flexible; with biopsy,                            single or multiple Diagnosis Code(s):        --- Professional ---                           D12.3, Benign neoplasm of transverse colon (hepatic                            flexure or splenic flexure)                           D12.0, Benign neoplasm of cecum                           D12.5, Benign neoplasm of sigmoid colon  K64.8, Other hemorrhoids                           R19.4, Change in bowel habit                           K57.30, Diverticulosis of large intestine without                            perforation or abscess without bleeding CPT copyright 2022 American Medical Association. All rights reserved. The codes documented in this report are preliminary and upon coder review may  be revised to meet current compliance requirements. [name], MD [name],  09/03/2024 10:59:43 AM This report has been signed electronically. Number of Addenda: 0

## 2024-09-03 NOTE — H&P (Signed)
 Suzanne Davis is an 83 y.o. female.   Chief Complaint: Diarrhea HPI: Suzanne Davis is a 83 y.o. female with past medical history of COPD, IBS, OSA, melanoma, anxiety, who presents for evaluation of loose bowel movements.   Patient was presenting some loose bowel movements.  For the last few days this improved as she cut down her intake of fiber. The patient denies having any nausea, vomiting, fever, chills, hematochezia, melena, hematemesis, abdominal distention, abdominal pain, jaundice, pruritus or weight loss.   Past Medical History:  Diagnosis Date   Abdominal pain 11/25/2018   Anxiety    COPD (chronic obstructive pulmonary disease) (HCC) 09/2019   Gallstones    Hyperlipidemia    Hypertension    IBS (irritable bowel syndrome) 1998   Melanoma (HCC) 2017   right arm   Sciatica    Sleep apnea 2022   Supraventricular tachycardia, paroxysmal (HCC) 1989    Past Surgical History:  Procedure Laterality Date   CHOLECYSTECTOMY  1990   at same time removed benign liver tumor   colonoscopy 2017     right shoulder spurs surgery  2000    Family History  Problem Relation Age of Onset   Diabetes Mother    Heart disease Mother    Prostate cancer Father    Colon cancer Neg Hx    Esophageal cancer Neg Hx    Rectal cancer Neg Hx    Social History:  reports that she has quit smoking. Her smoking use included cigarettes. She has never used smokeless tobacco. She reports that she does not drink alcohol and does not use drugs.  Allergies:  Allergies  Allergen Reactions   Ethambutol Other (See Comments)    High Fever--confirmed with sequential rechallenge   Tetracycline Hives   Doxycycline Other (See Comments)    Mouth soreness     Medications Prior to Admission  Medication Sig Dispense Refill   Ascorbic Acid (VITAMIN C WITH ROSE HIPS) 500 MG tablet Take 500 mg by mouth daily.     aspirin 81 MG chewable tablet Chew by mouth daily.     atorvastatin (LIPITOR) 10 MG tablet Take 10 mg by  mouth daily.     Biotin 89999 MCG TABS Take by mouth daily.     Cholecalciferol (VITAMIN D3) 10 MCG (400 UNIT) CAPS Take 2,000 Units by mouth daily.     Coenzyme Q10 (COQ10) 200 MG CAPS Take by mouth daily at 6 (six) AM.     colestipol  (COLESTID ) 1 g tablet Take 1 tablet (1 g total) by mouth daily. 90 tablet 1   Cyanocobalamin (VITAMIN B12) 3000 MCG SUBL Place under the tongue. Takes one on even days     lipase/protease/amylase (CREON ) 36000 UNITS CPEP capsule Take 2 capsules (72,000 Units total) by mouth 3 (three) times daily with meals. May also take 1 capsule (36,000 Units total) as needed (with snacks - up to 4 snacks daily). 300 capsule 11   metoprolol succinate (TOPROL-XL) 25 MG 24 hr tablet Take 25 mg by mouth daily.     Multiple Vitamin (MULTIVITAMIN) tablet Take 1 tablet by mouth daily.     Omega-3 Fatty Acids (FISH OIL) 1000 MG CPDR Take by mouth 2 (two) times daily.     OVER THE COUNTER MEDICATION Saline nebulizer daily.     nitroGLYCERIN (NITROSTAT) 0.4 MG SL tablet 0.4 mg every 5 (five) minutes as needed.      No results found for this or any previous visit (from the past 48 hours).  No results found.  Review of Systems  All other systems reviewed and are negative.   Blood pressure (!) 173/55, pulse 84, temperature 98.2 F (36.8 C), temperature source Oral, resp. rate 18, height 5' 8 (1.727 m), weight 59.9 kg, SpO2 96%. Physical Exam  GENERAL: The patient is AO x3, in no acute distress. HEENT: Head is normocephalic and atraumatic. EOMI are intact. Mouth is well hydrated and without lesions. NECK: Supple. No masses LUNGS: Clear to auscultation. No presence of rhonchi/wheezing/rales. Adequate chest expansion HEART: RRR, normal s1 and s2. ABDOMEN: Soft, nontender, no guarding, no peritoneal signs, and nondistended. BS +. No masses. EXTREMITIES: Without any cyanosis, clubbing, rash, lesions or edema. NEUROLOGIC: AOx3, no focal motor deficit. SKIN: no jaundice, no  rashes  Assessment/Plan Suzanne Davis is a 83 y.o. female with past medical history of COPD, IBS, OSA, melanoma, anxiety, who presents for evaluation of loose bowel movements.  Will proceed with colonoscopy.  Toribio Eartha Flavors, MD 09/03/2024, 10:08 AM

## 2024-09-03 NOTE — Transfer of Care (Signed)
 Immediate Anesthesia Transfer of Care Note  Patient: Suzanne Davis  Procedure(s) Performed: COLONOSCOPY  Patient Location: Endoscopy Unit  Anesthesia Type:General  Level of Consciousness: awake, alert , oriented, and patient cooperative  Airway & Oxygen  Therapy: Patient Spontanous Breathing  Post-op Assessment: Report given to RN and Post -op Vital signs reviewed and stable  Post vital signs: Reviewed and stable  Last Vitals:  Vitals Value Taken Time  BP 124/43 09/03/24 10:53  Temp 36.7 C 09/03/24 10:53  Pulse 71 09/03/24 10:53  Resp 18 09/03/24 10:53  SpO2 99 % 09/03/24 10:53    Last Pain:  Vitals:   09/03/24 1053  TempSrc: Oral  PainSc: 0-No pain      Patients Stated Pain Goal: 5 (09/03/24 0957)  Complications: No notable events documented.

## 2024-09-03 NOTE — Anesthesia Preprocedure Evaluation (Addendum)
 Anesthesia Evaluation  Patient identified by MRN, date of birth, ID band Patient awake    Reviewed: Allergy & Precautions, H&P , NPO status , Patient's Chart, lab work & pertinent test results, reviewed documented beta blocker date and time   Airway Mallampati: II  TM Distance: >3 FB Neck ROM: full    Dental no notable dental hx. (+) Dental Advisory Given, Teeth Intact   Pulmonary sleep apnea , COPD, former smoker   Pulmonary exam normal breath sounds clear to auscultation       Cardiovascular Exercise Tolerance: Good hypertension, Normal cardiovascular exam+ dysrhythmias Supra Ventricular Tachycardia  Rhythm:regular Rate:Normal     Neuro/Psych   Anxiety     negative neurological ROS  negative psych ROS   GI/Hepatic Neg liver ROS,GERD  ,,IBS   Endo/Other  negative endocrine ROS    Renal/GU negative Renal ROS  negative genitourinary   Musculoskeletal   Abdominal   Peds  Hematology negative hematology ROS (+)   Anesthesia Other Findings   Reproductive/Obstetrics negative OB ROS                              Anesthesia Physical Anesthesia Plan  ASA: 3  Anesthesia Plan: General   Post-op Pain Management: Minimal or no pain anticipated   Induction:   PONV Risk Score and Plan: Propofol  infusion  Airway Management Planned: Natural Airway and Nasal Cannula  Additional Equipment: None  Intra-op Plan:   Post-operative Plan:   Informed Consent: I have reviewed the patients History and Physical, chart, labs and discussed the procedure including the risks, benefits and alternatives for the proposed anesthesia with the patient or authorized representative who has indicated his/her understanding and acceptance.     Dental Advisory Given  Plan Discussed with: CRNA  Anesthesia Plan Comments:          Anesthesia Quick Evaluation

## 2024-09-03 NOTE — Discharge Instructions (Signed)
 You are being discharged to home.  Resume your previous diet.  We are waiting for your pathology results.  Your physician has recommended a repeat colonoscopy for surveillance based on pathology results.

## 2024-09-03 NOTE — Anesthesia Postprocedure Evaluation (Signed)
 Anesthesia Post Note  Patient: Suzanne Davis  Procedure(s) Performed: COLONOSCOPY  Patient location during evaluation: Phase II Anesthesia Type: General Level of consciousness: awake and alert Pain management: pain level controlled Vital Signs Assessment: post-procedure vital signs reviewed and stable Respiratory status: spontaneous breathing, nonlabored ventilation and respiratory function stable Cardiovascular status: stable Anesthetic complications: no   There were no known notable events for this encounter.   Last Vitals:  Vitals:   09/03/24 1053 09/03/24 1100  BP: (!) 124/43 (!) 113/39  Pulse: 71 61  Resp: 18 20  Temp: 36.7 C   SpO2: 99% 99%    Last Pain:  Vitals:   09/03/24 1053  TempSrc: Oral  PainSc: 0-No pain                 Kurk Corniel L Seann Genther

## 2024-09-06 ENCOUNTER — Encounter (INDEPENDENT_AMBULATORY_CARE_PROVIDER_SITE_OTHER): Payer: Self-pay | Admitting: *Deleted

## 2024-09-06 ENCOUNTER — Ambulatory Visit: Payer: Self-pay | Admitting: Gastroenterology

## 2024-09-06 LAB — SURGICAL PATHOLOGY

## 2024-09-06 NOTE — Progress Notes (Signed)
 Patient result letter mailed

## 2024-09-07 ENCOUNTER — Encounter (HOSPITAL_COMMUNITY): Payer: Self-pay | Admitting: Gastroenterology

## 2024-09-09 DIAGNOSIS — M810 Age-related osteoporosis without current pathological fracture: Secondary | ICD-10-CM | POA: Diagnosis not present

## 2024-09-09 DIAGNOSIS — Z780 Asymptomatic menopausal state: Secondary | ICD-10-CM | POA: Diagnosis not present

## 2024-09-20 DIAGNOSIS — M5459 Other low back pain: Secondary | ICD-10-CM | POA: Diagnosis not present

## 2024-09-20 DIAGNOSIS — M9905 Segmental and somatic dysfunction of pelvic region: Secondary | ICD-10-CM | POA: Diagnosis not present

## 2024-09-20 DIAGNOSIS — M9903 Segmental and somatic dysfunction of lumbar region: Secondary | ICD-10-CM | POA: Diagnosis not present

## 2024-09-21 ENCOUNTER — Telehealth (INDEPENDENT_AMBULATORY_CARE_PROVIDER_SITE_OTHER): Payer: Self-pay

## 2024-09-21 NOTE — Telephone Encounter (Signed)
 Patient called today stating for the last two days she has seen blood mixed in with her stools.( Seen in two Bm's yesterday and two Bm's today). She says the stools are brown with a mixture of an orange/red color mixed in. Patient had a Tcs done on 09/03/2024. She is wondering if this is coming from this procedure. She says the tissue is a orange color when she wipes on the tissue. Patient denies any sight of dark stools, shortness of breath, dizziness, Chest pain, fatigue, nausea and Vomiting, or fevers. She is having some periumbilical discomfort at times. She says she always has palpitations this is nothing new. Patient phone # is [phone].  Please advise.

## 2024-09-21 NOTE — Telephone Encounter (Signed)
 I spoke with the patient today, she reported her stools looked dark orange to rust brown.  She denied any fresh blood or melena, no hematochezia.  I explained to her that this is usually not concerning and likely related to her dietary intake.  She can keep monitoring the stool color and let us  know if it becomes one of the colors that I advised her were concerning for bleeding (fresh blood, black stool or red wine colored).

## 2024-09-21 NOTE — Telephone Encounter (Signed)
 noted

## 2024-09-22 DIAGNOSIS — M9905 Segmental and somatic dysfunction of pelvic region: Secondary | ICD-10-CM | POA: Diagnosis not present

## 2024-09-22 DIAGNOSIS — M9903 Segmental and somatic dysfunction of lumbar region: Secondary | ICD-10-CM | POA: Diagnosis not present

## 2024-09-22 DIAGNOSIS — Z23 Encounter for immunization: Secondary | ICD-10-CM | POA: Diagnosis not present

## 2024-09-22 DIAGNOSIS — M5459 Other low back pain: Secondary | ICD-10-CM | POA: Diagnosis not present

## 2024-09-24 DIAGNOSIS — M5459 Other low back pain: Secondary | ICD-10-CM | POA: Diagnosis not present

## 2024-09-24 DIAGNOSIS — M9905 Segmental and somatic dysfunction of pelvic region: Secondary | ICD-10-CM | POA: Diagnosis not present

## 2024-09-24 DIAGNOSIS — M9903 Segmental and somatic dysfunction of lumbar region: Secondary | ICD-10-CM | POA: Diagnosis not present

## 2024-10-06 DIAGNOSIS — M5432 Sciatica, left side: Secondary | ICD-10-CM | POA: Diagnosis not present

## 2024-10-08 DIAGNOSIS — M5134 Other intervertebral disc degeneration, thoracic region: Secondary | ICD-10-CM | POA: Diagnosis not present

## 2024-10-08 DIAGNOSIS — M47817 Spondylosis without myelopathy or radiculopathy, lumbosacral region: Secondary | ICD-10-CM | POA: Diagnosis not present

## 2024-10-08 DIAGNOSIS — M5432 Sciatica, left side: Secondary | ICD-10-CM | POA: Diagnosis not present

## 2024-10-08 DIAGNOSIS — M47816 Spondylosis without myelopathy or radiculopathy, lumbar region: Secondary | ICD-10-CM | POA: Diagnosis not present

## 2024-10-13 ENCOUNTER — Encounter (INDEPENDENT_AMBULATORY_CARE_PROVIDER_SITE_OTHER): Payer: Self-pay | Admitting: Gastroenterology

## 2024-10-13 DIAGNOSIS — L570 Actinic keratosis: Secondary | ICD-10-CM | POA: Diagnosis not present

## 2024-10-13 DIAGNOSIS — Z85820 Personal history of malignant melanoma of skin: Secondary | ICD-10-CM | POA: Diagnosis not present

## 2024-10-13 DIAGNOSIS — D485 Neoplasm of uncertain behavior of skin: Secondary | ICD-10-CM | POA: Diagnosis not present

## 2024-10-13 DIAGNOSIS — C44612 Basal cell carcinoma of skin of right upper limb, including shoulder: Secondary | ICD-10-CM | POA: Diagnosis not present

## 2024-10-13 DIAGNOSIS — Z85828 Personal history of other malignant neoplasm of skin: Secondary | ICD-10-CM | POA: Diagnosis not present

## 2024-10-13 DIAGNOSIS — I781 Nevus, non-neoplastic: Secondary | ICD-10-CM | POA: Diagnosis not present

## 2024-10-14 DIAGNOSIS — K219 Gastro-esophageal reflux disease without esophagitis: Secondary | ICD-10-CM | POA: Diagnosis not present

## 2024-10-14 DIAGNOSIS — M5432 Sciatica, left side: Secondary | ICD-10-CM | POA: Diagnosis not present

## 2024-10-14 DIAGNOSIS — F411 Generalized anxiety disorder: Secondary | ICD-10-CM | POA: Diagnosis not present

## 2024-10-14 DIAGNOSIS — I700 Atherosclerosis of aorta: Secondary | ICD-10-CM | POA: Diagnosis not present

## 2024-10-14 DIAGNOSIS — E7849 Other hyperlipidemia: Secondary | ICD-10-CM | POA: Diagnosis not present

## 2024-10-14 DIAGNOSIS — I10 Essential (primary) hypertension: Secondary | ICD-10-CM | POA: Diagnosis not present

## 2024-10-19 ENCOUNTER — Encounter (INDEPENDENT_AMBULATORY_CARE_PROVIDER_SITE_OTHER): Payer: Self-pay | Admitting: Gastroenterology

## 2024-10-28 DIAGNOSIS — L905 Scar conditions and fibrosis of skin: Secondary | ICD-10-CM | POA: Diagnosis not present

## 2024-10-28 DIAGNOSIS — C44612 Basal cell carcinoma of skin of right upper limb, including shoulder: Secondary | ICD-10-CM | POA: Diagnosis not present

## 2024-11-18 ENCOUNTER — Ambulatory Visit (INDEPENDENT_AMBULATORY_CARE_PROVIDER_SITE_OTHER): Admitting: Gastroenterology

## 2024-11-18 ENCOUNTER — Encounter (INDEPENDENT_AMBULATORY_CARE_PROVIDER_SITE_OTHER): Payer: Self-pay | Admitting: Gastroenterology

## 2024-11-18 VITALS — BP 170/73 | HR 65 | Temp 97.8°F | Ht 68.0 in | Wt 140.9 lb

## 2024-11-18 DIAGNOSIS — R1010 Upper abdominal pain, unspecified: Secondary | ICD-10-CM

## 2024-11-18 DIAGNOSIS — R140 Abdominal distension (gaseous): Secondary | ICD-10-CM

## 2024-11-18 DIAGNOSIS — R195 Other fecal abnormalities: Secondary | ICD-10-CM

## 2024-11-18 MED ORDER — PANCRELIPASE (LIP-PROT-AMYL) 36000-114000 UNITS PO CPEP
ORAL_CAPSULE | ORAL | 0 refills | Status: AC
Start: 2024-11-18 — End: ?

## 2024-11-18 NOTE — Patient Instructions (Signed)
 Start taking Creon  2 pills with each meal (samples provided).  Please update us  if you present improvement of your bowel movements with these. Explained presumed etiology of IBS symptoms. Patient was counseled about the benefit of implementing a low FODMAP to improve symptoms and recurrent episodes. A dietary list was provided to the patient. Also, the patient was counseled about the benefit of avoiding stressing situations and potential environmental triggers leading to symptomatology.  Will refer to [facility] for SIBO breath test

## 2024-11-18 NOTE — Progress Notes (Signed)
 [name], M.D. Gastroenterology & Hepatology [facility] Gastroenterology [address] [place], [place] [address]  Primary Care Physician: Orpha Yancey LABOR, MD [address] [place] [address]  I will communicate my assessment and recommendations to the referring MD via EMR.  Problems: Loose bowel movements.   History of Present Illness: Suzanne Davis is a 83 y.o. female with past medical history of COPD, IBS, OSA, melanoma, anxiety, who presents for evaluation of loose bowel movements.   The patient was last seen on 07/05/2024. At that time, the patient was started on colestipol  1 g every day.  She had fecal fat checked in stool which was abnormal, although pancreatic elastase was 770.  Fecal calprotectin was normal at 11 and TSH was slightly decreased at 0.25.  Given persistent symptoms she underwent colonoscopy with finding described low.  As patient was concerned about her stool consistency and there was presence of increased fat in the stool, she was prescribed Creon  in August. However, given price of the medication, she never started it.  Patient reports that she has presented persistent intermittent loose stools. This is not persistent but fluctuates. States has intermittent bloating, which is quite frequent. Also had some intermittent epigastric abdominal pain, which she has had for years.  The patient denies having any nausea, vomiting, fever, chills, hematochezia, melena, hematemesis, diarrhea, jaundice, pruritus or weight loss.  We have previously prescribed Questran  to the patient for diarrhea but she had paradoxical diarrhea.  Did not have any improvement with Benefiber. She had to stop colestipol  as it could interact with some of her medications.  Last EGD: 08/16/2013, performed by Dr. Donnel mild gastritis in antrum, Schatzki's ring was dilated up to 18 mm with savary dilator  Last Colonoscopy: 09/03/2024 - Three 3 to 5 mm polyps in the  transverse colon and in the cecum, removed with a cold snare. Resected and retrieved. - One 4 mm polyp in the sigmoid colon, removed with a cold snare. Resected and retrieved. - Diverticulosis in the sigmoid colon. Normal colon biopsied. - Non- bleeding internal hemorrhoids.  Path: A. COLON RANDOM BIOPSY:  -  Colonic mucosa with no significant pathology.   B. COLON CECAL TRANSVERSE POLYPECTOMY:  -  Tubular adenoma, fragments.   C. COLON SIGMOID POLYPECTOMY:  -  Hyperplastic polyp.   Past Medical History: Past Medical History:  Diagnosis Date   Abdominal pain 11/25/2018   Anxiety    COPD (chronic obstructive pulmonary disease) (HCC) 09/2019   Gallstones    Hyperlipidemia    Hypertension    IBS (irritable bowel syndrome) 1998   Melanoma (HCC) 2017   right arm   Sciatica    Sleep apnea 2022   Supraventricular tachycardia, paroxysmal 1989    Past Surgical History: Past Surgical History:  Procedure Laterality Date   CHOLECYSTECTOMY  1990   at same time removed benign liver tumor   COLONOSCOPY N/A 09/03/2024   Procedure: COLONOSCOPY;  Surgeon: Fortune Angelia Toribio, MD;  Location: [facility];  Service: Gastroenterology;  Laterality: N/A;  11:15 AM, ASA 1-2, pt can not come earlier   colonoscopy 2017     right shoulder spurs surgery  2000    Family History: Family History  Problem Relation Age of Onset   Diabetes Mother    Heart disease Mother    Prostate cancer Father    Colon cancer Neg Hx    Esophageal cancer Neg Hx    Rectal cancer Neg Hx  Social History: Social History   Tobacco Use  Smoking Status Former   Types: Cigarettes  Smokeless Tobacco Never  Tobacco Comments   Quit in 01/1980   Social History   Substance and Sexual Activity  Alcohol Use Never   Social History   Substance and Sexual Activity  Drug Use Never    Allergies: Allergies  Allergen Reactions   Ethambutol Other (See Comments)    High Fever--confirmed with sequential  rechallenge   Tetracycline Hives   Doxycycline Other (See Comments)    Mouth soreness     Medications: Current Outpatient Medications  Medication Sig Dispense Refill   Ascorbic Acid (VITAMIN C WITH ROSE HIPS) 500 MG tablet Take 500 mg by mouth daily.     aspirin 81 MG chewable tablet Chew by mouth daily.     atorvastatin (LIPITOR) 10 MG tablet Take 10 mg by mouth daily.     Cholecalciferol (VITAMIN D3) 10 MCG (400 UNIT) CAPS Take 2,000 Units by mouth daily.     Coenzyme Q10 (COQ10) 200 MG CAPS Take by mouth daily at 6 (six) AM.     Cyanocobalamin (VITAMIN B12) 3000 MCG SUBL Place under the tongue. Takes one on even days     metoprolol succinate (TOPROL-XL) 25 MG 24 hr tablet Take 25 mg by mouth daily.     Multiple Vitamin (MULTIVITAMIN) tablet Take 1 tablet by mouth daily.     nitroGLYCERIN (NITROSTAT) 0.4 MG SL tablet 0.4 mg every 5 (five) minutes as needed.     Omega-3 Fatty Acids (FISH OIL) 1000 MG CPDR Take by mouth 2 (two) times daily.     OVER THE COUNTER MEDICATION Saline nebulizer daily.     colestipol  (COLESTID ) 1 g tablet Take 1 tablet (1 g total) by mouth daily. (Patient not taking: Reported on 11/18/2024) 90 tablet 1   lipase/protease/amylase (CREON ) 36000 UNITS CPEP capsule Take 2 capsules (72,000 Units total) by mouth 3 (three) times daily with meals. May also take 1 capsule (36,000 Units total) as needed (with snacks - up to 4 snacks daily). (Patient not taking: No sig reported) 300 capsule 11   No current facility-administered medications for this visit.    Review of Systems: GENERAL: negative for malaise, night sweats HEENT: No changes in hearing or vision, no nose bleeds or other nasal problems. NECK: Negative for lumps, goiter, pain and significant neck swelling RESPIRATORY: Negative for cough, wheezing CARDIOVASCULAR: Negative for chest pain, leg swelling, palpitations, orthopnea GI: SEE HPI MUSCULOSKELETAL: Negative for joint pain or swelling, back pain, and  muscle pain. SKIN: Negative for lesions, rash PSYCH: Negative for sleep disturbance, mood disorder and recent psychosocial stressors. HEMATOLOGY Negative for prolonged bleeding, bruising easily, and swollen nodes. ENDOCRINE: Negative for cold or heat intolerance, polyuria, polydipsia and goiter. NEURO: negative for tremor, gait imbalance, syncope and seizures. The remainder of the review of systems is noncontributory.   Physical Exam: BP (!) 170/73 (BP Location: Left Arm, Patient Position: Sitting, Cuff Size: Normal)   Pulse 65   Temp 97.8 F (36.6 C) (Temporal)   Ht 5' 8 (1.727 m)   Wt 140 lb 14.4 oz (63.9 kg)   BMI 21.42 kg/m  GENERAL: The patient is AO x3, in no acute distress. HEENT: Head is normocephalic and atraumatic. EOMI are intact. Mouth is well hydrated and without lesions. NECK: Supple. No masses LUNGS: Clear to auscultation. No presence of rhonchi/wheezing/rales. Adequate chest expansion HEART: RRR, normal s1 and s2. ABDOMEN: Mildly tender upon palpation of the upper  abdomen, no guarding, no peritoneal signs, and nondistended. BS +. No masses. EXTREMITIES: Without any cyanosis, clubbing, rash, lesions or edema. NEUROLOGIC: AOx3, no focal motor deficit. SKIN: no jaundice, no rashes  Imaging/Labs: as above  I personally reviewed and interpreted the available labs, imaging and endoscopic files.  Impression and Plan: Suzanne Davis is a 83 y.o. female with past medical history of COPD, IBS, OSA, melanoma, anxiety, who presents for evaluation of loose bowel movements.  Patient had presented persistent issues with loose bowel habits, along with some abdominal discomfort.  These issues have been chronic but she has not presented any red flag signs.  We have previously evaluated for some organic etiologies with endoscopies and stool based testing, but these have been unremarkable so far.  It is very possible her symptoms are related to IBS, for which she will benefit from  implementing a low FODMAP diet.  However, we will perform SIBO breath test to make sure she does not have bacterial overgrowth aggravating her symptoms.    In the past, we have discussed the use of pancreatic enzymes for possible EPI as her fecal fat was elevated.  However, starting this medication was cost prohibited.  We will provide some samples and she will assess symptom improvement -if this leads to significant relief of symptoms, she will let us  know to send a new prescription.  -Start taking Creon  2 pills with each meal (samples provided).  The patient she will update us  if tenting improvement of bowel movements with these. -Explained presumed etiology of IBS symptoms. Patient was counseled about the benefit of implementing a low FODMAP to improve symptoms and recurrent episodes. A dietary list was provided to the patient. Also, the patient was counseled about the benefit of avoiding stressing situations and potential environmental triggers leading to symptomatology.  -Will refer to [facility] for SIBO breath test   All questions were answered.      [name], MD Gastroenterology and Hepatology [facility] Gastroenterology

## 2024-12-07 DIAGNOSIS — G894 Chronic pain syndrome: Secondary | ICD-10-CM | POA: Diagnosis not present

## 2024-12-07 DIAGNOSIS — M7918 Myalgia, other site: Secondary | ICD-10-CM | POA: Diagnosis not present

## 2024-12-07 DIAGNOSIS — M549 Dorsalgia, unspecified: Secondary | ICD-10-CM | POA: Diagnosis not present

## 2024-12-07 DIAGNOSIS — M5416 Radiculopathy, lumbar region: Secondary | ICD-10-CM | POA: Diagnosis not present

## 2024-12-13 ENCOUNTER — Telehealth (INDEPENDENT_AMBULATORY_CARE_PROVIDER_SITE_OTHER): Payer: Self-pay | Admitting: Gastroenterology

## 2024-12-13 ENCOUNTER — Other Ambulatory Visit (INDEPENDENT_AMBULATORY_CARE_PROVIDER_SITE_OTHER): Payer: Self-pay | Admitting: Gastroenterology

## 2024-12-13 DIAGNOSIS — K63829 Intestinal methanogen overgrowth, unspecified: Secondary | ICD-10-CM

## 2024-12-13 MED ORDER — NEOMYCIN SULFATE 500 MG PO TABS: 500.0000 mg | ORAL_TABLET | Freq: Two times a day (BID) | ORAL | 0 refills | Status: AC

## 2024-12-13 MED ORDER — RIFAXIMIN 550 MG PO TABS: 550.0000 mg | ORAL_TABLET | Freq: Three times a day (TID) | ORAL | 0 refills | Status: AC

## 2024-12-13 NOTE — Telephone Encounter (Signed)
 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  Unfortunately she did not answer my call and I left a detailed voice message explaining that this will need to be treated with a combination of Xifaxan  and neomycin .  These medications will be sent to her pharmacy.

## 2024-12-13 NOTE — Telephone Encounter (Signed)
 Pt left voicemail stating that she was needing to talk to [facility] about a test result. Dr.Castaneda in office;spoke with him in real time and Dr.Castaneda states he will give patient a call

## 2024-12-14 ENCOUNTER — Telehealth (INDEPENDENT_AMBULATORY_CARE_PROVIDER_SITE_OTHER): Payer: Self-pay

## 2024-12-14 ENCOUNTER — Encounter (INDEPENDENT_AMBULATORY_CARE_PROVIDER_SITE_OTHER): Payer: Self-pay

## 2024-12-14 DIAGNOSIS — K63829 Intestinal methanogen overgrowth, unspecified: Secondary | ICD-10-CM | POA: Insufficient documentation

## 2024-12-14 DIAGNOSIS — L218 Other seborrheic dermatitis: Secondary | ICD-10-CM | POA: Diagnosis not present

## 2024-12-14 NOTE — Telephone Encounter (Signed)
 [name]

## 2024-12-14 NOTE — Telephone Encounter (Signed)
 [address] [address] [place], [place] [address] Roseanne Mui [address] [place], [place] [address] Dear Donold Delmon: 12/14/2024 Member ID: [id number] Authorization #: [id number] [place] has approved a request from you or your doctor for XIFAXAN  Tablet 550MG . This approval is for 11/30/2024 to 12/28/2024. This drug has been approved under the Member's Medicare Part D benefit. Approved quantity: 42 units per 14 day(s). You may fill up to a 90 day supply except for those on Specialty Tier 5, which can be filled up to a 30 day supply. Please call the pharmacy to process the prescription claim. This approval is good only for the dates listed above. If you stay on this drugs after the end date, you may need another approval. If you don't get prior approval from us , you may have to pay the full price for the drug. If you have questions, please call Member Services at [phone] (TTY: [phone]). From October 1 to March 31, you can call us  7 days a week from 8 a.m. to 8 p.m. From April 1 to September 30, you can call us  Monday through Friday from 8 a.m. to 8 p.m. A messaging system is used after hours, weekends, and on federal holidays. Sincerely,

## 2024-12-14 NOTE — Telephone Encounter (Signed)
 Patient called today says she is hesitant about taking Neomycin , She has had some drug interactions in the past, and says she read the side of effects of Neomycin , which states, it may cause permanent hearing loss, Kidney and liver damage. She would like to know if there is something else she can take. I did make her aware that the neomycin  and the Xifaxan  are usually taken together, and her insurance approved the Xifaxan  today and she would need to call the pharmacy to see if this was ready.   Please advise if there is an alternative to the Neomycin ?   Thanks,

## 2024-12-15 NOTE — Telephone Encounter (Signed)
 I spoke with the patient and made her aware per Dr. Eartha, Unfortunately, for this type of bacteria this is the only recommended antibiotic.   Patient states understanding.

## 2024-12-15 NOTE — Telephone Encounter (Signed)
 Tried calling patient no answer. I left a vm, asking patient to please return call to the office.
